# Patient Record
Sex: Female | Born: 1940 | Race: White | Hispanic: No | Smoking: Never smoker
Health system: Southern US, Community
[De-identification: ages and names within clinical notes are randomized; demographics above are authoritative.]

## PROBLEM LIST (undated history)

## (undated) DIAGNOSIS — N189 Chronic kidney disease, unspecified: Secondary | ICD-10-CM

## (undated) DIAGNOSIS — Z8489 Family history of other specified conditions: Secondary | ICD-10-CM

## (undated) DIAGNOSIS — M1990 Unspecified osteoarthritis, unspecified site: Secondary | ICD-10-CM

## (undated) DIAGNOSIS — R112 Nausea with vomiting, unspecified: Secondary | ICD-10-CM

## (undated) DIAGNOSIS — M797 Fibromyalgia: Secondary | ICD-10-CM

## (undated) DIAGNOSIS — F419 Anxiety disorder, unspecified: Secondary | ICD-10-CM

## (undated) DIAGNOSIS — I10 Essential (primary) hypertension: Secondary | ICD-10-CM

## (undated) DIAGNOSIS — E860 Dehydration: Secondary | ICD-10-CM

## (undated) DIAGNOSIS — D649 Anemia, unspecified: Secondary | ICD-10-CM

## (undated) DIAGNOSIS — C801 Malignant (primary) neoplasm, unspecified: Secondary | ICD-10-CM

## (undated) DIAGNOSIS — K219 Gastro-esophageal reflux disease without esophagitis: Secondary | ICD-10-CM

## (undated) DIAGNOSIS — Z87442 Personal history of urinary calculi: Secondary | ICD-10-CM

## (undated) DIAGNOSIS — I82409 Acute embolism and thrombosis of unspecified deep veins of unspecified lower extremity: Secondary | ICD-10-CM

## (undated) DIAGNOSIS — Z9889 Other specified postprocedural states: Secondary | ICD-10-CM

## (undated) DIAGNOSIS — R7303 Prediabetes: Secondary | ICD-10-CM

## (undated) DIAGNOSIS — E039 Hypothyroidism, unspecified: Secondary | ICD-10-CM

## (undated) HISTORY — PX: APPENDECTOMY: SHX54

## (undated) HISTORY — PX: ROTATOR CUFF REPAIR: SHX139

## (undated) HISTORY — PX: BREAST SURGERY: SHX581

## (undated) HISTORY — PX: COLONOSCOPY: SHX174

---

## 1974-11-07 HISTORY — PX: CHOLECYSTECTOMY: SHX55

## 1983-11-08 HISTORY — PX: CARPAL TUNNEL RELEASE: SHX101

## 1983-11-08 HISTORY — PX: THYROIDECTOMY: SHX17

## 1992-11-07 HISTORY — PX: BACK SURGERY: SHX140

## 2006-03-16 ENCOUNTER — Ambulatory Visit (HOSPITAL_BASED_OUTPATIENT_CLINIC_OR_DEPARTMENT_OTHER): Admission: RE | Admit: 2006-03-16 | Discharge: 2006-03-16 | Payer: Self-pay | Admitting: Urology

## 2012-11-07 DIAGNOSIS — C801 Malignant (primary) neoplasm, unspecified: Secondary | ICD-10-CM

## 2012-11-07 HISTORY — DX: Malignant (primary) neoplasm, unspecified: C80.1

## 2013-11-07 HISTORY — PX: COLON SURGERY: SHX602

## 2016-01-18 DIAGNOSIS — I82409 Acute embolism and thrombosis of unspecified deep veins of unspecified lower extremity: Secondary | ICD-10-CM

## 2016-01-18 HISTORY — DX: Acute embolism and thrombosis of unspecified deep veins of unspecified lower extremity: I82.409

## 2016-10-10 ENCOUNTER — Other Ambulatory Visit: Payer: Self-pay | Admitting: Obstetrics and Gynecology

## 2016-10-20 NOTE — Patient Instructions (Signed)
Your procedure is scheduled on:  Monday, Dec. 18, 2017  Enter through the [employer] [facility] at:  10:15 AM  Pick up the phone at the desk and dial [phone].  Call this number if you have problems the morning of surgery: [phone].  Remember: Do NOT eat food or drink after:  Midnight Sunday  Take these medicines the morning of surgery with a SIP OF WATER: Gabapentin, Letrozole, Levothyroxine, Lisinopril, Omeprazole   Stop ALL herbal medications at this time   Do NOT wear jewelry (body piercing), metal hair clips/bobby pins, make-up, or nail polish. Do NOT wear lotions, powders, or perfumes.  You may wear deodorant. Do NOT shave for 48 hours prior to surgery. Do NOT bring valuables to the hospital. Contacts, dentures, or bridgework may not be worn into surgery.  Have a responsible adult drive you home and stay with you for 24 hours after your procedure

## 2016-10-21 ENCOUNTER — Inpatient Hospital Stay (HOSPITAL_COMMUNITY)
Admission: RE | Admit: 2016-10-21 | Discharge: 2016-10-21 | Disposition: A | Discharge status: Home / Self Care | Payer: Self-pay | Source: Ambulatory Visit

## 2016-10-24 ENCOUNTER — Encounter (HOSPITAL_COMMUNITY): Payer: Self-pay

## 2016-10-24 ENCOUNTER — Ambulatory Visit (HOSPITAL_COMMUNITY): Payer: Medicare Other | Admitting: Anesthesiology

## 2016-10-24 ENCOUNTER — Encounter (HOSPITAL_COMMUNITY)
Admission: RE | Disposition: A | Discharge status: Home / Self Care | Payer: Self-pay | Source: Ambulatory Visit | Attending: Obstetrics and Gynecology

## 2016-10-24 ENCOUNTER — Ambulatory Visit (HOSPITAL_COMMUNITY)
Admission: RE | Admit: 2016-10-24 | Discharge: 2016-10-24 | Disposition: A | Discharge status: Home / Self Care | Payer: Medicare Other | Source: Ambulatory Visit | Attending: Obstetrics and Gynecology | Admitting: Obstetrics and Gynecology

## 2016-10-24 DIAGNOSIS — E039 Hypothyroidism, unspecified: Secondary | ICD-10-CM | POA: Diagnosis not present

## 2016-10-24 DIAGNOSIS — Z86718 Personal history of other venous thrombosis and embolism: Secondary | ICD-10-CM | POA: Diagnosis not present

## 2016-10-24 DIAGNOSIS — M1990 Unspecified osteoarthritis, unspecified site: Secondary | ICD-10-CM | POA: Diagnosis not present

## 2016-10-24 DIAGNOSIS — I10 Essential (primary) hypertension: Secondary | ICD-10-CM | POA: Insufficient documentation

## 2016-10-24 DIAGNOSIS — Z853 Personal history of malignant neoplasm of breast: Secondary | ICD-10-CM | POA: Diagnosis not present

## 2016-10-24 DIAGNOSIS — N8501 Benign endometrial hyperplasia: Secondary | ICD-10-CM | POA: Insufficient documentation

## 2016-10-24 DIAGNOSIS — R9389 Abnormal findings on diagnostic imaging of other specified body structures: Secondary | ICD-10-CM

## 2016-10-24 DIAGNOSIS — E7800 Pure hypercholesterolemia, unspecified: Secondary | ICD-10-CM | POA: Insufficient documentation

## 2016-10-24 DIAGNOSIS — N858 Other specified noninflammatory disorders of uterus: Secondary | ICD-10-CM | POA: Diagnosis not present

## 2016-10-24 DIAGNOSIS — Z888 Allergy status to other drugs, medicaments and biological substances status: Secondary | ICD-10-CM | POA: Insufficient documentation

## 2016-10-24 DIAGNOSIS — K219 Gastro-esophageal reflux disease without esophagitis: Secondary | ICD-10-CM | POA: Insufficient documentation

## 2016-10-24 DIAGNOSIS — N840 Polyp of corpus uteri: Secondary | ICD-10-CM

## 2016-10-24 DIAGNOSIS — R938 Abnormal findings on diagnostic imaging of other specified body structures: Secondary | ICD-10-CM | POA: Diagnosis present

## 2016-10-24 HISTORY — DX: Hypothyroidism, unspecified: E03.9

## 2016-10-24 HISTORY — DX: Personal history of urinary calculi: Z87.442

## 2016-10-24 HISTORY — PX: DILATATION & CURETTAGE/HYSTEROSCOPY WITH MYOSURE: SHX6511

## 2016-10-24 HISTORY — DX: Other specified postprocedural states: Z98.890

## 2016-10-24 HISTORY — DX: Malignant (primary) neoplasm, unspecified: C80.1

## 2016-10-24 HISTORY — DX: Acute embolism and thrombosis of unspecified deep veins of unspecified lower extremity: I82.409

## 2016-10-24 HISTORY — DX: Other specified postprocedural states: R11.2

## 2016-10-24 HISTORY — DX: Unspecified osteoarthritis, unspecified site: M19.90

## 2016-10-24 HISTORY — DX: Gastro-esophageal reflux disease without esophagitis: K21.9

## 2016-10-24 HISTORY — DX: Essential (primary) hypertension: I10

## 2016-10-24 LAB — BASIC METABOLIC PANEL
Anion gap: 8 (ref 5–15)
BUN: 28 mg/dL — AB (ref 6–20)
CHLORIDE: 106 mmol/L (ref 101–111)
CO2: 24 mmol/L (ref 22–32)
CREATININE: 1.07 mg/dL — AB (ref 0.44–1.00)
Calcium: 9.8 mg/dL (ref 8.9–10.3)
GFR calc Af Amer: 57 mL/min — ABNORMAL LOW (ref >60)
GFR calc non Af Amer: 49 mL/min — ABNORMAL LOW (ref >60)
Glucose, Bld: 97 mg/dL (ref 65–99)
Potassium: 4.3 mmol/L (ref 3.5–5.1)
SODIUM: 138 mmol/L (ref 135–145)

## 2016-10-24 LAB — CBC
HCT: 40.9 % (ref 36.0–46.0)
HEMOGLOBIN: 13.7 g/dL (ref 12.0–15.0)
MCH: 32 pg (ref 26.0–34.0)
MCHC: 33.5 g/dL (ref 30.0–36.0)
MCV: 95.6 fL (ref 78.0–100.0)
Platelets: 263 10*3/uL (ref 150–400)
RBC: 4.28 MIL/uL (ref 3.87–5.11)
RDW: 15.4 % (ref 11.5–15.5)
WBC: 9.3 10*3/uL (ref 4.0–10.5)

## 2016-10-24 SURGERY — DILATATION & CURETTAGE/HYSTEROSCOPY WITH MYOSURE
Anesthesia: General | Site: Vagina

## 2016-10-24 MED ORDER — SODIUM CHLORIDE 0.9 % IR SOLN
Status: DC | PRN
Start: 2016-10-24 — End: 2016-10-24
  Administered 2016-10-24: 3000 mL

## 2016-10-24 MED ORDER — PROPOFOL 10 MG/ML IV BOLUS
INTRAVENOUS | Status: AC
  Filled 2016-10-24: qty 20

## 2016-10-24 MED ORDER — LACTATED RINGERS IV SOLN
INTRAVENOUS | Status: DC
  Administered 2016-10-24: 11:00:00 via INTRAVENOUS

## 2016-10-24 MED ORDER — PROPOFOL 10 MG/ML IV BOLUS
INTRAVENOUS | Status: DC | PRN
  Administered 2016-10-24 (×3): 20 mg via INTRAVENOUS
  Administered 2016-10-24: 120 mg via INTRAVENOUS
  Administered 2016-10-24: 20 mg via INTRAVENOUS

## 2016-10-24 MED ORDER — DEXAMETHASONE SODIUM PHOSPHATE 10 MG/ML IJ SOLN
INTRAMUSCULAR | Status: DC | PRN
  Administered 2016-10-24: 4 mg via INTRAVENOUS

## 2016-10-24 MED ORDER — EPHEDRINE 5 MG/ML INJ
INTRAVENOUS | Status: AC
  Filled 2016-10-24: qty 10

## 2016-10-24 MED ORDER — FENTANYL CITRATE (PF) 100 MCG/2ML IJ SOLN
INTRAMUSCULAR | Status: DC | PRN
  Administered 2016-10-24: 100 ug via INTRAVENOUS

## 2016-10-24 MED ORDER — LIDOCAINE HCL (CARDIAC) 20 MG/ML IV SOLN
INTRAVENOUS | Status: AC
  Filled 2016-10-24: qty 5

## 2016-10-24 MED ORDER — ONDANSETRON HCL 4 MG/2ML IJ SOLN
INTRAMUSCULAR | Status: AC
  Filled 2016-10-24: qty 2

## 2016-10-24 MED ORDER — LIDOCAINE HCL (CARDIAC) 20 MG/ML IV SOLN
INTRAVENOUS | Status: DC | PRN
  Administered 2016-10-24: 60 mg via INTRAVENOUS

## 2016-10-24 MED ORDER — DEXAMETHASONE SODIUM PHOSPHATE 4 MG/ML IJ SOLN
INTRAMUSCULAR | Status: AC
  Filled 2016-10-24: qty 1

## 2016-10-24 MED ORDER — FENTANYL CITRATE (PF) 100 MCG/2ML IJ SOLN
INTRAMUSCULAR | Status: AC
  Filled 2016-10-24: qty 2

## 2016-10-24 MED ORDER — EPHEDRINE SULFATE 50 MG/ML IJ SOLN
INTRAMUSCULAR | Status: DC | PRN
  Administered 2016-10-24 (×2): 10 mg via INTRAVENOUS

## 2016-10-24 MED ORDER — MIDAZOLAM HCL 2 MG/2ML IJ SOLN
INTRAMUSCULAR | Status: DC | PRN
  Administered 2016-10-24: 1 mg via INTRAVENOUS

## 2016-10-24 MED ORDER — ONDANSETRON HCL 4 MG/2ML IJ SOLN
INTRAMUSCULAR | Status: DC | PRN
  Administered 2016-10-24: 4 mg via INTRAVENOUS

## 2016-10-24 MED ORDER — MIDAZOLAM HCL 2 MG/2ML IJ SOLN
INTRAMUSCULAR | Status: AC
  Filled 2016-10-24: qty 2

## 2016-10-24 MED ORDER — PHENYLEPHRINE 40 MCG/ML (10ML) SYRINGE FOR IV PUSH (FOR BLOOD PRESSURE SUPPORT)
PREFILLED_SYRINGE | INTRAVENOUS | Status: AC
  Filled 2016-10-24: qty 10

## 2016-10-24 SURGICAL SUPPLY — 20 items
CANISTER SUCT 3000ML (MISCELLANEOUS) ×4 IMPLANT
CATH ROBINSON RED A/P 16FR (CATHETERS) ×2 IMPLANT
CLOTH BEACON ORANGE TIMEOUT ST (SAFETY) ×2 IMPLANT
CONTAINER PREFILL 10% NBF 60ML (FORM) ×2 IMPLANT
DEVICE MYOSURE LITE (MISCELLANEOUS) ×2 IMPLANT
DEVICE MYOSURE REACH (MISCELLANEOUS) IMPLANT
ELECT REM PT RETURN 9FT ADLT (ELECTROSURGICAL)
ELECTRODE REM PT RTRN 9FT ADLT (ELECTROSURGICAL) IMPLANT
FILTER ARTHROSCOPY CONVERTOR (FILTER) ×2 IMPLANT
GLOVE BIOGEL PI IND STRL 7.0 (GLOVE) ×2 IMPLANT
GLOVE BIOGEL PI INDICATOR 7.0 (GLOVE) ×2
GLOVE ECLIPSE 6.5 STRL STRAW (GLOVE) ×2 IMPLANT
GOWN STRL REUS W/TWL LRG LVL3 (GOWN DISPOSABLE) ×4 IMPLANT
PACK VAGINAL MINOR WOMEN LF (CUSTOM PROCEDURE TRAY) ×2 IMPLANT
PAD OB MATERNITY 4.3X12.25 (PERSONAL CARE ITEMS) ×2 IMPLANT
SEAL ROD LENS SCOPE MYOSURE (ABLATOR) ×2 IMPLANT
TOWEL OR 17X24 6PK STRL BLUE (TOWEL DISPOSABLE) ×4 IMPLANT
TUBING AQUILEX INFLOW (TUBING) ×2 IMPLANT
TUBING AQUILEX OUTFLOW (TUBING) ×2 IMPLANT
WATER STERILE IRR 1000ML POUR (IV SOLUTION) ×2 IMPLANT

## 2016-10-24 NOTE — Anesthesia Procedure Notes (Signed)
Procedure Name: LMA Insertion Date/Time: 10/24/2016 12:06 PM Performed by: Jonna Munro Pre-anesthesia Checklist: Patient identified, Emergency Drugs available, Suction available, Patient being monitored and Timeout performed Patient Re-evaluated:Patient Re-evaluated prior to inductionOxygen Delivery Method: Circle system utilized Preoxygenation: Pre-oxygenation with 100% oxygen Intubation Type: IV induction LMA: LMA inserted LMA Size: 4.0 Number of attempts: 1 Placement Confirmation: positive ETCO2 and breath sounds checked- equal and bilateral Tube secured with: Tape Dental Injury: Teeth and Oropharynx as per pre-operative assessment

## 2016-10-24 NOTE — Anesthesia Preprocedure Evaluation (Addendum)
Anesthesia Evaluation  Patient identified by MRN, date of birth, ID band Patient awake    Reviewed: Allergy & Precautions, NPO status , Patient's Chart, lab work & pertinent test results  History of Anesthesia Complications (+) PONVNegative for: history of anesthetic complications  Airway Mallampati: II  TM Distance: >3 FB Neck ROM: Full    Dental no notable dental hx. (+) Dental Advisory Given, Partial Upper, Partial Lower   Pulmonary neg pulmonary ROS,    Pulmonary exam normal        Cardiovascular hypertension, Pt. on medications Normal cardiovascular exam     Neuro/Psych negative neurological ROS  negative psych ROS   GI/Hepatic Neg liver ROS, GERD  ,  Endo/Other  Hypothyroidism   Renal/GU negative Renal ROS     Musculoskeletal  (+) Arthritis ,   Abdominal   Peds  Hematology   Anesthesia Other Findings   Reproductive/Obstetrics                            Anesthesia Physical Anesthesia Plan  ASA: III  Anesthesia Plan: General   Post-op Pain Management:    Induction: Intravenous  Airway Management Planned: LMA  Additional Equipment:   Intra-op Plan:   Post-operative Plan: Extubation in OR  Informed Consent:   Plan Discussed with:   Anesthesia Plan Comments:         Anesthesia Quick Evaluation

## 2016-10-24 NOTE — Brief Op Note (Signed)
10/24/2016  1:04 PM  PATIENT:  Teresa Kidd  75 y.o. female  PRE-OPERATIVE DIAGNOSIS:  Endometrial Mass, Endometrial Thickening on Sonogram, History of Breast Cancer  POST-OPERATIVE DIAGNOSIS:  Endometrial Polyp, Endometrial Thickening on Sonogram, History of Breast Cancer  PROCEDURE:  Diagnostic hysteroscopy, hysteroscopic resection of endometrial polyp using myosure, dilation and curettage  SURGEON:  Surgeon(s) and Role:    * Servando Salina, MD - Primary  PHYSICIAN ASSISTANT:   ASSISTANTS: none   ANESTHESIA:   general FINDINGS; posterior endometrial polyp, atrophic endometrium, right tubal ostia seen, left ostia sclerosed  EBL:  Total I/O In: 800 [I.V.:800] Out: 25 [Urine:20; Blood:5]  BLOOD ADMINISTERED:none  DRAINS: none   LOCAL MEDICATIONS USED:  NONE  SPECIMEN:  Source of Specimen:  endometrial polyp, emc  DISPOSITION OF SPECIMEN:  PATHOLOGY  COUNTS:  YES  TOURNIQUET:  * No tourniquets in log *  DICTATION: .Other Dictation: Dictation Number [id number]  PLAN OF CARE: Discharge to home after PACU  PATIENT DISPOSITION:  PACU - hemodynamically stable.   Delay start of Pharmacological VTE agent (>24hrs) due to surgical blood loss or risk of bleeding: no

## 2016-10-24 NOTE — Anesthesia Postprocedure Evaluation (Signed)
Anesthesia Post Note  Patient: Teresa Kidd  Procedure(s) Performed: Procedure(s) (LRB): DILATATION & CURETTAGE/HYSTEROSCOPY WITH MYOSURE (N/A)  Patient location during evaluation: PACU Anesthesia Type: General Level of consciousness: sedated Pain management: pain level controlled Vital Signs Assessment: post-procedure vital signs reviewed and stable Respiratory status: spontaneous breathing and respiratory function stable Cardiovascular status: stable Anesthetic complications: no        Last Vitals:  Vitals:   10/24/16 1315 10/24/16 1330  BP: 121/62 126/60  Pulse: 62 71  Resp: 10 20  Temp:      Last Pain:  Vitals:   10/24/16 1315  TempSrc:   PainSc: 1    Pain Goal: Patients Stated Pain Goal: 3 (10/24/16 0955)               [place]

## 2016-10-24 NOTE — Transfer of Care (Signed)
Immediate Anesthesia Transfer of Care Note  Patient: Teresa Kidd  Procedure(s) Performed: Procedure(s): DILATATION & CURETTAGE/HYSTEROSCOPY WITH MYOSURE (N/A)  Patient Location: PACU  Anesthesia Type:General  Level of Consciousness: awake, alert  and oriented  Airway & Oxygen Therapy: Patient Spontanous Breathing and Patient connected to nasal cannula oxygen  Post-op Assessment: Report given to RN and Post -op Vital signs reviewed and stable  Post vital signs: Reviewed and stable  Last Vitals:  Vitals:   10/24/16 0955  BP: 124/73  Pulse: 72  Resp: 20  Temp: 36.7 C    Last Pain:  Vitals:   10/24/16 0955  TempSrc: Oral      Patients Stated Pain Goal: 3 ([id number])  Complications: No apparent anesthesia complications

## 2016-10-24 NOTE — Discharge Instructions (Signed)
DISCHARGE INSTRUCTIONS: D&C / D&E The following instructions have been prepared to help you care for yourself upon your return home.   Personal hygiene:  Use sanitary pads for vaginal drainage, not tampons.  Shower the day after your procedure.  NO tub baths, pools or Jacuzzis for 2-3 weeks.  Wipe front to back after using the bathroom.  Activity and limitations:  Do NOT drive or operate any equipment for 24 hours. The effects of anesthesia are still present and drowsiness may result.  Do NOT rest in bed all day.  Walking is encouraged.  Walk up and down stairs slowly.  You may resume your normal activity in one to two days or as indicated by your physician.  Sexual activity: NO intercourse for at least 2 weeks after the procedure, or as indicated by your physician.  Diet: Eat a light meal as desired this evening. You may resume your usual diet tomorrow.  Return to work: You may resume your work activities in one to two days or as indicated by your doctor.  What to expect after your surgery: Expect to have vaginal bleeding/discharge for 2-3 days and spotting for up to 10 days. It is not unusual to have soreness for up to 1-2 weeks. You may have a slight burning sensation when you urinate for the first day. Mild cramps may continue for a couple of days. You may have a regular period in 2-6 weeks.  Call your doctor for any of the following:  Excessive vaginal bleeding, saturating and changing one pad every hour.  Inability to urinate 6 hours after discharge from hospital.  Pain not relieved by pain medication.  Fever of 100.4 F or greater.  Unusual vaginal discharge or odor.   Call for an appointment:    Post Anesthesia Home Care Instructions  Activity: Get plenty of rest for the remainder of the day. A responsible adult should stay with you for 24 hours following the procedure.  For the next 24 hours, DO NOT: -Drive a car -Paediatric nurse -Drink alcoholic  beverages -Take any medication unless instructed by your physician -Make any legal decisions or sign important papers.  Meals: Start with liquid foods such as gelatin or soup. Progress to regular foods as tolerated. Avoid greasy, spicy, heavy foods. If nausea and/or vomiting occur, drink only clear liquids until the nausea and/or vomiting subsides. Call your physician if vomiting continues.  Special Instructions/Symptoms: Your throat may feel dry or sore from the anesthesia or the breathing tube placed in your throat during surgery. If this causes discomfort, gargle with warm salt water. The discomfort should disappear within 24 hours.  If you had a scopolamine patch placed behind your ear for the management of post- operative nausea and/or vomiting:  1. The medication in the patch is effective for 72 hours, after which it should be removed.  Wrap patch in a tissue and discard in the trash. Wash hands thoroughly with soap and water. 2. You may remove the patch earlier than 72 hours if you experience unpleasant side effects which may include dry mouth, dizziness or visual disturbances. 3. Avoid touching the patch. Wash your hands with soap and water after contact with the patch.      CALL  IF TEMP>100.4, NOTHING PER VAGINA X 1 WK, CALL IF SOAKING A MAXI  PAD EVERY HOUR OR MORE FREQUENTLY

## 2016-10-25 NOTE — Op Note (Signed)
Teresa Kidd, Teresa Kidd NO.:  [id number]  MEDICAL RECORD NO.:  [id number]  LOCATION:                                 FACILITY:  PHYSICIAN:  Servando Salina, M.D.DATE OF BIRTH:  1941/03/15  DATE OF PROCEDURE:  10/24/2016 DATE OF DISCHARGE:                              OPERATIVE REPORT   PREOPERATIVE DIAGNOSIS:  Endometrial thickening on sonogram, endometrial mass, history breast cancer.  PROCEDURE:  Diagnostic hysteroscopy, hysteroscopic resection of endometrial polyp using MyoSure, dilation and curettage.  POSTOPERATIVE DIAGNOSES:  Endometrial thickening on sonogram, endometrial polyps, history of breast cancer.  ANESTHESIA:  General.  SURGEON:  Servando Salina, M.D.  ASSISTANT:  None.  DESCRIPTION OF PROCEDURE:  Under adequate general anesthesia, the patient was placed in a dorsal lithotomy position.  She was sterilely prepped and draped in usual fashion.  Bladder was catheterized for a small amount of urine.  Examination under anesthesia revealed an anteflexed uterus.  No adnexal masses could be appreciated.  A bivalve speculum was placed in vagina.  Single-tooth tenaculum was placed on the anterior lip of the cervix.  The cervix was then serially dilated up to #[facility] dilator.  A MyoSure hysteroscope was introduced into the uterine cavity.  At that point, pale atrophic endometrium was noted. Posteriorly, there was an endometrial polyp.  The right tubal ostia could be seen.  The left was sclerosed.  Using the Lite MyoSure resectoscope, the posterior polypoid lesion was removed.  The hysteroscope was then removed.  The cavity was gently curetted for scant amount of tissue.  The procedure was felt to be completed at which time, all instruments removed from the vagina.  SPECIMEN:  Labeled endometrial curetting with endometrial polyp was sent to Pathology.  ESTIMATED BLOOD LOSS:  5 mL.  URINE OUTPUT:  20 mL.  INTRAOPERATIVE FLUID:  800  mL.  SPONGE AND INSTRUMENT COUNT:  X2 was correct.  COMPLICATION:  None.  The patient tolerated the procedure well and was transferred to recovery room in stable condition.     Servando Salina, M.D.   ______________________________ Servando Salina, M.D.    [place]/MEDQ  D:  10/24/2016  T:  10/25/2016  Job:  [id number]

## 2016-10-26 ENCOUNTER — Encounter (HOSPITAL_COMMUNITY): Payer: Self-pay | Admitting: Obstetrics and Gynecology

## 2017-08-01 ENCOUNTER — Other Ambulatory Visit: Payer: Self-pay | Admitting: Obstetrics and Gynecology

## 2017-08-14 ENCOUNTER — Other Ambulatory Visit: Payer: Self-pay | Admitting: Urology

## 2017-08-16 NOTE — Progress Notes (Signed)
04-27-17 EKG and LOV from Dr. Hinda [facility] [place] on chart.

## 2017-08-16 NOTE — Patient Instructions (Signed)
Lillah Standre  08/16/2017   Your procedure is scheduled on: 08-22-17   Report to [facility] Main  Entrance Take [place]  elevators to 3rd floor to  [place] at 5:30 AM.   Call this number if you have problems the morning of surgery [phone]   Remember: ONLY 1 PERSON MAY GO WITH YOU TO SHORT STAY TO GET  READY MORNING OF [place].  Do not eat food or drink liquids :After Midnight.     Take these medicines the morning of surgery with A SIP OF WATER: Duloxetine (Cymbalta), Gabapentin (Neurontin),  Femara (Letrozole), and Omeprazole  (Prilosec)                                You may not have any metal on your body including hair pins and              piercings  Do not wear jewelry, make-up, lotions, powders or perfumes, deodorant             Do not wear nail polish.  Do not shave  48 hours prior to surgery.             Do not bring valuables to the hospital. [place].  Contacts, dentures or bridgework may not be worn into surgery.  Leave suitcase in the car. After surgery it may be brought to your room.                Please read over the following fact sheets you were given: _____________________________________________________________________             [facility] - Preparing for Surgery Before surgery, you can play an important role.  Because skin is not sterile, your skin needs to be as free of germs as possible.  You can reduce the number of germs on your skin by washing with CHG (chlorahexidine gluconate) soap before surgery.  CHG is an antiseptic cleaner which kills germs and bonds with the skin to continue killing germs even after washing. Please DO NOT use if you have an allergy to CHG or antibacterial soaps.  If your skin becomes reddened/irritated stop using the CHG and inform your nurse when you arrive at Short Stay. Do not shave (including legs and underarms) for at least 48 hours  prior to the first CHG shower.  You may shave your face/neck. Please follow these instructions carefully:  1.  Shower with CHG Soap the night before surgery and the  morning of Surgery.  2.  If you choose to wash your hair, wash your hair first as usual with your  normal  shampoo.  3.  After you shampoo, rinse your hair and body thoroughly to remove the  shampoo.                           4.  Use CHG as you would any other liquid soap.  You can apply chg directly  to the skin and wash                       Gently with a scrungie or clean washcloth.  5.  Apply the CHG Soap  to your body ONLY FROM THE NECK DOWN.   Do not use on face/ open                           Wound or open sores. Avoid contact with eyes, ears mouth and genitals (private parts).                       Wash face,  Genitals (private parts) with your normal soap.             6.  Wash thoroughly, paying special attention to the area where your surgery  will be performed.  7.  Thoroughly rinse your body with warm water from the neck down.  8.  DO NOT shower/wash with your normal soap after using and rinsing off  the CHG Soap.                9.  Pat yourself dry with a clean towel.            10.  Wear clean pajamas.            11.  Place clean sheets on your bed the night of your first shower and do not  sleep with pets. Day of Surgery : Do not apply any lotions/deodorants the morning of surgery.  Please wear clean clothes to the hospital/surgery center.  FAILURE TO FOLLOW THESE INSTRUCTIONS MAY RESULT IN THE CANCELLATION OF YOUR SURGERY PATIENT SIGNATURE_________________________________  NURSE SIGNATURE__________________________________  ________________________________________________________________________

## 2017-08-17 ENCOUNTER — Encounter (HOSPITAL_COMMUNITY)
Admission: RE | Admit: 2017-08-17 | Discharge: 2017-08-17 | Disposition: A | Discharge status: Home / Self Care | Payer: Medicare Other | Source: Ambulatory Visit | Attending: Urology | Admitting: Urology

## 2017-08-17 ENCOUNTER — Encounter (HOSPITAL_COMMUNITY): Payer: Self-pay

## 2017-08-17 DIAGNOSIS — N814 Uterovaginal prolapse, unspecified: Secondary | ICD-10-CM | POA: Diagnosis not present

## 2017-08-17 DIAGNOSIS — Z01812 Encounter for preprocedural laboratory examination: Secondary | ICD-10-CM | POA: Insufficient documentation

## 2017-08-17 DIAGNOSIS — Z0183 Encounter for blood typing: Secondary | ICD-10-CM | POA: Insufficient documentation

## 2017-08-17 LAB — CBC
HCT: 38.3 % (ref 36.0–46.0)
Hemoglobin: 12.4 g/dL (ref 12.0–15.0)
MCH: 31.4 pg (ref 26.0–34.0)
MCHC: 32.4 g/dL (ref 30.0–36.0)
MCV: 97 fL (ref 78.0–100.0)
PLATELETS: 231 10*3/uL (ref 150–400)
RBC: 3.95 MIL/uL (ref 3.87–5.11)
RDW: 14.4 % (ref 11.5–15.5)
WBC: 9 10*3/uL (ref 4.0–10.5)

## 2017-08-17 LAB — BASIC METABOLIC PANEL
Anion gap: 9 (ref 5–15)
BUN: 19 mg/dL (ref 6–20)
CHLORIDE: 103 mmol/L (ref 101–111)
CO2: 28 mmol/L (ref 22–32)
CREATININE: 1.15 mg/dL — AB (ref 0.44–1.00)
Calcium: 9.6 mg/dL (ref 8.9–10.3)
GFR calc Af Amer: 52 mL/min — ABNORMAL LOW (ref >60)
GFR calc non Af Amer: 45 mL/min — ABNORMAL LOW (ref >60)
Glucose, Bld: 107 mg/dL — ABNORMAL HIGH (ref 65–99)
Potassium: 4.3 mmol/L (ref 3.5–5.1)
SODIUM: 140 mmol/L (ref 135–145)

## 2017-08-17 LAB — ABO/RH: ABO/RH(D): A NEG

## 2017-08-17 LAB — PROTIME-INR
INR: 0.86
Prothrombin Time: 11.6 seconds (ref 11.4–15.2)

## 2017-08-17 LAB — APTT: aPTT: 27 seconds (ref 24–36)

## 2017-08-17 NOTE — Progress Notes (Signed)
04-27-17 Cardiac clearance from Dr. Hinda [facility]. F/u in 1year

## 2017-08-21 NOTE — H&P (Signed)
CC/HPI: Ms Spellman was assessed in the past and we discussed the possibility of her undergoing a transvaginal hysterectomy, a vault suspension and cystocele, and a possible rectocele and/or sling. There is a growth on her uterus and her gynecologist from [place] felt she should have a hysterectomy; therefore, she may wish to have her prolapse surgery at the same time and have myself perform both. She had chose watchful waiting years ago. Since then, she keeps using her pessary almost daily. Since then, she has had breast cancer. Two weeks ago she had a less swollen leg and is now on Xarelto for a left lower leg blood clot.   She has urge incontinence and denies stress incontinence and enuresis. She wears 8 to 9 liners a day of varying severity of leakage.  She voids every 30 to 60 minutes and cannot sit through a 2 hour movie. She gets up 1 to 3 times at night. She sometimes has a poor to good flow and sometimes strains.   She t has had a kidney stone treated by Dr Jeffie Pollock. She has had 3 lower back operations. When she does not wear the pessary, she can feel vaginal bulging. She is prone to constipation. Presentation has not been medically treated.   On pelvic examination, Ms Lemme had a grade 3 cystocele with central defect that just reached the introitus. The cervix descended from 8 or 9 cm to approximately 4 cm. She had a mid vaginal smaller grade 2 rectocele. It was moderate in size and she had laxity of tissues. She had a grade 2 hypermobility of the bladder neck and a negative cough test after cystoscopy.   Ms Obi has worsening urge incontinence, and if she has stress incontinence it appears to be minimal based upon today's history. She has worsening prolapse still requiring a pessary. She just had a blood clot. She has at least hourly frequency and mild to moderate nocturia. The role of cystoscopy discussed.   If Ms Ybanez ever had surgery, she would likely best benefit from a transvaginal  hysterectomy with a vault suspension, a cystocele repair and graft, and likely a rectocele repair. I would have to repeat her urodynamics since now she clinically has an overactive bladder but no stress incontinence.   Ms Oshields will likely transfer her care here. She understands she cannot have surgery for at least 6 months. She is going to see Dr Garwin Brothers and get her gynecologist's medical records for her to review. I will order the urodynamics, and we will proceed accordingly likely within the distant future.   Today on urodynamics her maximum capacity is 220 mL. Her bladder was unstable reaching pressures of 36 cm of water but she did not leak. At 200 mL at 99 cm water she had mildly case. These are with the prolapse reduced. During voluntary voiding she voided 160 mL with a maximum flow of 8 mils per second. Maximum voiding pressures 32 cm water. EMG activity was normal. She appears efficiently. Bladder neck to 70 cm and she has significant prolapse noted   Last visit July 2017  I drew the patient a picture. I talked her about a transvaginal vault suspension with cystocele repair and graft and probable rectocele repair and hysterectomy by Dr. cousins. She does not have stress incontinence with the pessary in for a number reasons I do not recommend sling. Mesh issues were discussed. Delayed sling may be necessary in the future.the patient understands a clinically she primarily has an OAB bladder  and this is treated with medication   I went over my concerns about blood clots especially in a patient with breast cancer and with pelvic surgery with an anesthetic time approximately 3 Hours. She is assessed by Dr. Rosalita Chessman was a vascular physician in [place].  By history her prolapse is bothering her even more than usual. She has failed pessary. She really wants surgery. She is being assessed by Dr. cousins today. I will speak to her tonight. I have a medical clearance letter but she did note that a  hematologist in [place] said she is prone to blood clots based upon blood work. She understands my deep concerns regarding this with the length of her surgery as noted above. I briefly reviewed the surgery and postoperative course again  She is scheduled for surgery I believe October 16     ALLERGIES: [place] CAPS    MEDICATIONS: Levothyroxine Sodium  Levothyroxine Sodium  Lisinopril  Lisinopril  Omeprazole  Omeprazole  Aleve TABS Oral  Aspirin TABS Oral  Atenolol 50 mg tablet Oral  Biotin TABS Oral  Calcium TABS Oral  Duloxetine Hcl  Gabapentin  Glucosamine CAPS Oral  Hydroxychloroquine Sulfate  Letrozole  Letrozole  Multi-Vitamin Oral Tablet Oral  Prednisone  Tizanidine Hcl  Tizanidine Hcl  Xarelto     GU PSH: Complex cystometrogram, w/ void pressure and urethral pressure profile studies, any technique - 05/03/2016 Complex Uroflow - 05/03/2016 Cystoscopy Ureteroscopy - 2011 Emg surf Electrd - 05/03/2016 Inject For cystogram - 05/03/2016 Intrabd voidng Press - 05/03/2016      PSH Notes: Cystoscopy With Ureteroscopy Right, Thyroid Surgery, Shoulder Surgery, Cholecystectomy, Back Surgery   NON-GU PSH: Cholecystectomy (open) - 2011    GU PMH: Cystocele, Unspec, Vaginal wall prolapse - 02/08/2016 Mixed incontinence, Urge and stress incontinence - 02/08/2016 Nocturia, Nocturia - 02/08/2016, Nocturia, - 2014 Urinary Frequency, Increased urinary frequency - 02/08/2016 Chronic cystitis (w/o hematuria), Chronic cystitis - 2014 History of urolithiasis, Nephrolithiasis - 2014 Hydronephrosis Unspec, Hydronephrosis, right - 2014 Incontinence w/o Sensation, Urinary incontinence without sensory awareness - 2014 Rectocele, Rectocele - 2014 Renal cyst, Renal cyst, acquired - 2014      PMH Notes:  2010-09-20 12:42:45 - Note: Arthritis   NON-GU PMH: Encounter for general adult medical examination without abnormal findings, Encounter for preventive health examination - 02/08/2016 Personal  history of other diseases of the circulatory system, History of hypertension - 2014 Personal history of other endocrine, nutritional and metabolic disease, History of hypercholesterolemia - 2014    FAMILY HISTORY: Breast Cancer - Sister Death In The Family Father - Runs In Family Death In The Family Mother - Runs In Family Family Health Status Number - Runs In Family Gastric Cancer - Sister   SOCIAL HISTORY: None    Notes: Never A Smoker, Alcohol Use, Occupation:, Tobacco Use, Marital History - Single, Caffeine Use   REVIEW OF SYSTEMS:    GU Review Female:   Patient denies frequent urination, hard to postpone urination, burning /pain with urination, get up at night to urinate, leakage of urine, stream starts and stops, trouble starting your stream, have to strain to urinate, and being pregnant.  Gastrointestinal (Upper):   Patient denies nausea, vomiting, and indigestion/ heartburn.  Gastrointestinal (Lower):   Patient reports constipation. Patient denies diarrhea.  Constitutional:   Patient reports fatigue. Patient denies fever, night sweats, and weight loss.  Skin:   Patient denies skin rash/ lesion and itching.  Eyes:   Patient reports blurred vision. Patient denies double vision.  Ears/  Nose/ Throat:   Patient reports sinus problems. Patient denies sore throat.  Hematologic/Lymphatic:   Patient denies swollen glands and easy bruising.  Cardiovascular:   Patient reports leg swelling. Patient denies chest pains.  Respiratory:   Patient denies shortness of breath and cough.  Endocrine:   Patient denies excessive thirst.  Musculoskeletal:   Patient reports back pain and joint pain.   Neurological:   Patient denies headaches and dizziness.  Psychologic:   Patient denies depression and anxiety.   VITAL SIGNS:      08/03/2017 02:09 PM  Weight 137 lb / 62.14 kg  Height 60 in / 152.4 cm  BP 158/78 mmHg  Pulse 69 /min  Temperature 97.9 F / 36.6 C  BMI 26.8 kg/m   PAST DATA REVIEWED:   Source Of History:  Patient   PROCEDURES: None   ASSESSMENT:      ICD-10 Details  1 GU:   Cystocele, Unspec - N81.10      PLAN:           Orders Labs Urine Culture          Schedule Return Visit/Planned Activity: Return PRN - Office Visit             Note: we will call           Document Letter(s):  Created for Patient: Clinical Summary     After a thorough review of the management options for the patient's condition the patient  elected to proceed with surgical therapy as noted above. We have discussed the potential benefits and risks of the procedure, side effects of the proposed treatment, the likelihood of the patient achieving the goals of the procedure, and any potential problems that might occur during the procedure or recuperation. Informed consent has been obtained.

## 2017-08-22 ENCOUNTER — Encounter (HOSPITAL_COMMUNITY)
Admission: RE | Disposition: A | Discharge status: Home / Self Care | Payer: Self-pay | Source: Ambulatory Visit | Attending: Urology

## 2017-08-22 ENCOUNTER — Encounter (HOSPITAL_COMMUNITY): Payer: Self-pay | Admitting: *Deleted

## 2017-08-22 ENCOUNTER — Inpatient Hospital Stay (HOSPITAL_COMMUNITY): Payer: Medicare Other | Admitting: Certified Registered"

## 2017-08-22 ENCOUNTER — Inpatient Hospital Stay (HOSPITAL_COMMUNITY)
Admission: RE | Admit: 2017-08-22 | Discharge: 2017-08-23 | DRG: 743 | Disposition: A | Discharge status: Home / Self Care | Payer: Medicare Other | Source: Ambulatory Visit | Attending: Urology | Admitting: Urology

## 2017-08-22 DIAGNOSIS — N72 Inflammatory disease of cervix uteri: Secondary | ICD-10-CM | POA: Diagnosis present

## 2017-08-22 DIAGNOSIS — Z803 Family history of malignant neoplasm of breast: Secondary | ICD-10-CM

## 2017-08-22 DIAGNOSIS — E039 Hypothyroidism, unspecified: Secondary | ICD-10-CM | POA: Diagnosis present

## 2017-08-22 DIAGNOSIS — N8111 Cystocele, midline: Secondary | ICD-10-CM | POA: Diagnosis present

## 2017-08-22 DIAGNOSIS — Z9049 Acquired absence of other specified parts of digestive tract: Secondary | ICD-10-CM

## 2017-08-22 DIAGNOSIS — Z87442 Personal history of urinary calculi: Secondary | ICD-10-CM | POA: Diagnosis not present

## 2017-08-22 DIAGNOSIS — N814 Uterovaginal prolapse, unspecified: Secondary | ICD-10-CM | POA: Diagnosis present

## 2017-08-22 DIAGNOSIS — Z79899 Other long term (current) drug therapy: Secondary | ICD-10-CM

## 2017-08-22 DIAGNOSIS — I10 Essential (primary) hypertension: Secondary | ICD-10-CM | POA: Diagnosis present

## 2017-08-22 DIAGNOSIS — Z853 Personal history of malignant neoplasm of breast: Secondary | ICD-10-CM | POA: Diagnosis not present

## 2017-08-22 DIAGNOSIS — K219 Gastro-esophageal reflux disease without esophagitis: Secondary | ICD-10-CM | POA: Diagnosis present

## 2017-08-22 DIAGNOSIS — Z800 Family history of malignant neoplasm of digestive organs: Secondary | ICD-10-CM | POA: Diagnosis not present

## 2017-08-22 DIAGNOSIS — Z7982 Long term (current) use of aspirin: Secondary | ICD-10-CM | POA: Diagnosis not present

## 2017-08-22 DIAGNOSIS — Z86718 Personal history of other venous thrombosis and embolism: Secondary | ICD-10-CM | POA: Diagnosis not present

## 2017-08-22 DIAGNOSIS — D259 Leiomyoma of uterus, unspecified: Secondary | ICD-10-CM | POA: Diagnosis present

## 2017-08-22 DIAGNOSIS — D271 Benign neoplasm of left ovary: Secondary | ICD-10-CM | POA: Diagnosis present

## 2017-08-22 HISTORY — PX: CYSTOSCOPY: SHX5120

## 2017-08-22 HISTORY — PX: VAGINAL PROLAPSE REPAIR: SHX830

## 2017-08-22 HISTORY — PX: SALPINGOOPHORECTOMY: SHX82

## 2017-08-22 HISTORY — PX: VAGINAL HYSTERECTOMY: SHX2639

## 2017-08-22 HISTORY — PX: CYSTOCELE REPAIR: SHX163

## 2017-08-22 LAB — HEMOGLOBIN AND HEMATOCRIT, BLOOD
HCT: 36.6 % (ref 36.0–46.0)
HEMOGLOBIN: 11.9 g/dL — AB (ref 12.0–15.0)

## 2017-08-22 LAB — TYPE AND SCREEN
ABO/RH(D): A NEG
ANTIBODY SCREEN: NEGATIVE

## 2017-08-22 SURGERY — COLPORRHAPHY, ANTERIOR, FOR CYSTOCELE REPAIR
Anesthesia: General

## 2017-08-22 MED ORDER — FENTANYL CITRATE (PF) 100 MCG/2ML IJ SOLN
INTRAMUSCULAR | Status: AC
  Filled 2017-08-22: qty 2

## 2017-08-22 MED ORDER — ROCURONIUM BROMIDE 100 MG/10ML IV SOLN
INTRAVENOUS | Status: DC | PRN
  Administered 2017-08-22: 20 mg via INTRAVENOUS
  Administered 2017-08-22: 50 mg via INTRAVENOUS
  Administered 2017-08-22: 10 mg via INTRAVENOUS

## 2017-08-22 MED ORDER — ESTRADIOL 0.1 MG/GM VA CREA
TOPICAL_CREAM | VAGINAL | Status: DC | PRN
  Administered 2017-08-22: 1 via VAGINAL

## 2017-08-22 MED ORDER — LACTATED RINGERS IV SOLN
INTRAVENOUS | Status: DC | PRN
  Administered 2017-08-22 (×3): via INTRAVENOUS

## 2017-08-22 MED ORDER — HYDROXYCHLOROQUINE SULFATE 200 MG PO TABS
200.0000 mg | ORAL_TABLET | Freq: Every day | ORAL | Status: DC
  Administered 2017-08-22 – 2017-08-23 (×2): 200 mg via ORAL
  Filled 2017-08-22 (×2): qty 1

## 2017-08-22 MED ORDER — ESTRADIOL 0.1 MG/GM VA CREA
TOPICAL_CREAM | VAGINAL | Status: AC
  Filled 2017-08-22: qty 85

## 2017-08-22 MED ORDER — ONDANSETRON HCL 4 MG/2ML IJ SOLN: 4.0000 mg | INTRAMUSCULAR | Status: DC | PRN

## 2017-08-22 MED ORDER — PREDNISOLONE 5 MG PO TABS
5.0000 mg | ORAL_TABLET | Freq: Every day | ORAL | Status: DC
  Administered 2017-08-22 – 2017-08-23 (×2): 5 mg via ORAL
  Filled 2017-08-22 (×2): qty 1

## 2017-08-22 MED ORDER — DEXTROSE-NACL 5-0.45 % IV SOLN
INTRAVENOUS | Status: DC
  Administered 2017-08-22 – 2017-08-23 (×2): via INTRAVENOUS

## 2017-08-22 MED ORDER — ONDANSETRON HCL 4 MG/2ML IJ SOLN: 4.0000 mg | Freq: Four times a day (QID) | INTRAMUSCULAR | Status: DC | PRN

## 2017-08-22 MED ORDER — LEVOTHYROXINE SODIUM 75 MCG PO TABS
75.0000 ug | ORAL_TABLET | Freq: Every day | ORAL | Status: DC
  Administered 2017-08-22 – 2017-08-23 (×2): 75 ug via ORAL
  Filled 2017-08-22 (×2): qty 1

## 2017-08-22 MED ORDER — LIDOCAINE 2% (20 MG/ML) 5 ML SYRINGE
INTRAMUSCULAR | Status: AC
  Filled 2017-08-22: qty 25

## 2017-08-22 MED ORDER — HYDROCODONE-ACETAMINOPHEN 5-325 MG PO TABS: 1.0000 | ORAL_TABLET | Freq: Four times a day (QID) | ORAL | 0 refills | Status: DC | PRN

## 2017-08-22 MED ORDER — STERILE WATER FOR IRRIGATION IR SOLN
Status: DC | PRN
  Administered 2017-08-22: 1

## 2017-08-22 MED ORDER — SODIUM CHLORIDE 0.9 % IV SOLN
INTRAVENOUS | Status: DC | PRN
  Administered 2017-08-22: 500 mL

## 2017-08-22 MED ORDER — OXYCODONE HCL 5 MG PO TABS: 5.0000 mg | ORAL_TABLET | Freq: Once | ORAL | Status: DC | PRN

## 2017-08-22 MED ORDER — GLYCOPYRROLATE 0.2 MG/ML IJ SOLN
INTRAMUSCULAR | Status: DC | PRN
  Administered 2017-08-22: 0.2 mg via INTRAVENOUS

## 2017-08-22 MED ORDER — FENTANYL CITRATE (PF) 250 MCG/5ML IJ SOLN
INTRAMUSCULAR | Status: AC
  Filled 2017-08-22: qty 5

## 2017-08-22 MED ORDER — DIPHENHYDRAMINE HCL 50 MG/ML IJ SOLN: 12.5000 mg | Freq: Four times a day (QID) | INTRAMUSCULAR | Status: DC | PRN

## 2017-08-22 MED ORDER — LISINOPRIL 5 MG PO TABS
5.0000 mg | ORAL_TABLET | Freq: Every day | ORAL | Status: DC
  Administered 2017-08-23: 5 mg via ORAL
  Filled 2017-08-22 (×2): qty 1

## 2017-08-22 MED ORDER — SCOPOLAMINE 1 MG/3DAYS TD PT72
MEDICATED_PATCH | TRANSDERMAL | Status: DC | PRN
  Administered 2017-08-22: 1 via TRANSDERMAL

## 2017-08-22 MED ORDER — DIPHENHYDRAMINE HCL 12.5 MG/5ML PO ELIX: 12.5000 mg | ORAL_SOLUTION | Freq: Four times a day (QID) | ORAL | Status: DC | PRN

## 2017-08-22 MED ORDER — LIDOCAINE-EPINEPHRINE (PF) 1 %-1:200000 IJ SOLN
INTRAMUSCULAR | Status: DC | PRN
  Administered 2017-08-22: 30 mL

## 2017-08-22 MED ORDER — LORATADINE 10 MG PO TABS
10.0000 mg | ORAL_TABLET | Freq: Every day | ORAL | Status: DC
  Administered 2017-08-22 – 2017-08-23 (×2): 10 mg via ORAL
  Filled 2017-08-22 (×2): qty 1

## 2017-08-22 MED ORDER — MIDAZOLAM HCL 2 MG/2ML IJ SOLN
INTRAMUSCULAR | Status: AC
  Filled 2017-08-22: qty 2

## 2017-08-22 MED ORDER — LETROZOLE 2.5 MG PO TABS
2.5000 mg | ORAL_TABLET | Freq: Every day | ORAL | Status: DC
Start: 2017-08-23 — End: 2017-08-23
  Administered 2017-08-23: 2.5 mg via ORAL
  Filled 2017-08-22: qty 1

## 2017-08-22 MED ORDER — ACETAMINOPHEN 325 MG PO TABS
650.0000 mg | ORAL_TABLET | ORAL | Status: DC | PRN
  Administered 2017-08-22: 650 mg via ORAL
  Filled 2017-08-22: qty 2

## 2017-08-22 MED ORDER — LIDOCAINE HCL (CARDIAC) 20 MG/ML IV SOLN
INTRAVENOUS | Status: DC | PRN
  Administered 2017-08-22: 60 mg via INTRATRACHEAL

## 2017-08-22 MED ORDER — MIDAZOLAM HCL 2 MG/2ML IJ SOLN
INTRAMUSCULAR | Status: DC | PRN
  Administered 2017-08-22: 2 mg via INTRAVENOUS

## 2017-08-22 MED ORDER — MORPHINE SULFATE (PF) 2 MG/ML IV SOLN: 2.0000 mg | INTRAVENOUS | Status: DC | PRN

## 2017-08-22 MED ORDER — PROPOFOL 10 MG/ML IV BOLUS
INTRAVENOUS | Status: DC | PRN
  Administered 2017-08-22: 80 mg via INTRAVENOUS

## 2017-08-22 MED ORDER — SCOPOLAMINE 1 MG/3DAYS TD PT72
MEDICATED_PATCH | TRANSDERMAL | Status: AC
  Filled 2017-08-22: qty 1

## 2017-08-22 MED ORDER — CEFAZOLIN SODIUM-DEXTROSE 2-4 GM/100ML-% IV SOLN
2.0000 g | INTRAVENOUS | Status: AC
  Administered 2017-08-22: 2 g via INTRAVENOUS

## 2017-08-22 MED ORDER — FENTANYL CITRATE (PF) 100 MCG/2ML IJ SOLN: 25.0000 ug | INTRAMUSCULAR | Status: DC | PRN

## 2017-08-22 MED ORDER — HYDROCODONE-ACETAMINOPHEN 5-325 MG PO TABS: 1.0000 | ORAL_TABLET | ORAL | Status: DC | PRN

## 2017-08-22 MED ORDER — CEFAZOLIN SODIUM-DEXTROSE 2-4 GM/100ML-% IV SOLN
INTRAVENOUS | Status: AC
Start: 2017-08-22 — End: 2017-08-22
  Filled 2017-08-22: qty 100

## 2017-08-22 MED ORDER — ONDANSETRON HCL 4 MG/2ML IJ SOLN
INTRAMUSCULAR | Status: DC | PRN
  Administered 2017-08-22: 4 mg via INTRAVENOUS

## 2017-08-22 MED ORDER — EPHEDRINE SULFATE 50 MG/ML IJ SOLN
INTRAMUSCULAR | Status: DC | PRN
  Administered 2017-08-22 (×5): 10 mg via INTRAVENOUS

## 2017-08-22 MED ORDER — GABAPENTIN 300 MG PO CAPS
600.0000 mg | ORAL_CAPSULE | Freq: Three times a day (TID) | ORAL | Status: DC
  Administered 2017-08-22 – 2017-08-23 (×3): 600 mg via ORAL
  Filled 2017-08-22 (×3): qty 2

## 2017-08-22 MED ORDER — LIDOCAINE-EPINEPHRINE (PF) 1 %-1:200000 IJ SOLN
INTRAMUSCULAR | Status: AC
  Filled 2017-08-22: qty 30

## 2017-08-22 MED ORDER — FLEET ENEMA 7-19 GM/118ML RE ENEM: 1.0000 | ENEMA | Freq: Once | RECTAL | Status: DC

## 2017-08-22 MED ORDER — DULOXETINE HCL 30 MG PO CPEP
30.0000 mg | ORAL_CAPSULE | Freq: Every day | ORAL | Status: DC
  Administered 2017-08-22 – 2017-08-23 (×2): 30 mg via ORAL
  Filled 2017-08-22 (×2): qty 1

## 2017-08-22 MED ORDER — OXYCODONE HCL 5 MG/5ML PO SOLN: 5.0000 mg | Freq: Once | ORAL | Status: DC | PRN

## 2017-08-22 MED ORDER — LIDOCAINE-EPINEPHRINE (PF) 1 %-1:200000 IJ SOLN
INTRAMUSCULAR | Status: AC
  Filled 2017-08-22: qty 60

## 2017-08-22 MED ORDER — PROPOFOL 10 MG/ML IV BOLUS
INTRAVENOUS | Status: AC
  Filled 2017-08-22: qty 40

## 2017-08-22 MED ORDER — SODIUM CHLORIDE 0.9 % IV SOLN
INTRAVENOUS | Status: AC
  Filled 2017-08-22: qty 500000

## 2017-08-22 MED ORDER — FENTANYL CITRATE (PF) 250 MCG/5ML IJ SOLN
INTRAMUSCULAR | Status: DC | PRN
  Administered 2017-08-22 (×2): 50 ug via INTRAVENOUS
  Administered 2017-08-22: 100 ug via INTRAVENOUS
  Administered 2017-08-22 (×2): 50 ug via INTRAVENOUS

## 2017-08-22 MED ORDER — SUGAMMADEX SODIUM 200 MG/2ML IV SOLN
INTRAVENOUS | Status: DC | PRN
  Administered 2017-08-22: 120 mg via INTRAVENOUS

## 2017-08-22 MED ORDER — PHENAZOPYRIDINE HCL 200 MG PO TABS
200.0000 mg | ORAL_TABLET | Freq: Once | ORAL | Status: AC
  Administered 2017-08-22: 200 mg via ORAL
  Filled 2017-08-22: qty 1

## 2017-08-22 MED ORDER — PANTOPRAZOLE SODIUM 40 MG PO TBEC
40.0000 mg | DELAYED_RELEASE_TABLET | Freq: Every day | ORAL | Status: DC
  Administered 2017-08-23: 40 mg via ORAL
  Filled 2017-08-22: qty 1

## 2017-08-22 MED ORDER — DEXAMETHASONE SODIUM PHOSPHATE 10 MG/ML IJ SOLN
INTRAMUSCULAR | Status: DC | PRN
  Administered 2017-08-22: 10 mg via INTRAVENOUS
  Administered 2017-08-22: 8 mg via INTRAVENOUS

## 2017-08-22 SURGICAL SUPPLY — 64 items
ALLOGRAFT TUTOPLAST AXIS 6X12 (Tissue) ×2 IMPLANT
BAG URINE DRAINAGE (UROLOGICAL SUPPLIES) ×3 IMPLANT
BAG URO CATCHER STRL LF (MISCELLANEOUS) ×3 IMPLANT
BLADE SURG 15 STRL LF DISP TIS (BLADE) ×2 IMPLANT
BLADE SURG 15 STRL SS (BLADE) ×1
CATH FOLEY 2WAY SLVR  5CC 14FR (CATHETERS) ×1
CATH FOLEY 2WAY SLVR 5CC 14FR (CATHETERS) ×2 IMPLANT
CLOTH BEACON ORANGE TIMEOUT ST (SAFETY) ×6 IMPLANT
COVER MAYO STAND STRL (DRAPES) ×3 IMPLANT
COVER SURGICAL LIGHT HANDLE (MISCELLANEOUS) ×6 IMPLANT
DECANTER SPIKE VIAL GLASS SM (MISCELLANEOUS) ×3 IMPLANT
DEVICE CAPIO SLIM SINGLE (INSTRUMENTS) ×6 IMPLANT
DRAIN PENROSE 18X1/4 LTX STRL (WOUND CARE) ×3 IMPLANT
DRAPE SHEET LG 3/4 BI-LAMINATE (DRAPES) ×3 IMPLANT
DRAPE STERI URO 9X17 APER PCH (DRAPES) ×3 IMPLANT
ELECT PENCIL ROCKER SW 15FT (MISCELLANEOUS) ×3 IMPLANT
GAUZE PACKING 2X5 YD STRL (GAUZE/BANDAGES/DRESSINGS) ×3 IMPLANT
GAUZE SPONGE 4X4 16PLY XRAY LF (GAUZE/BANDAGES/DRESSINGS) ×9 IMPLANT
GLOVE BIO SURGEON STRL SZ 6.5 (GLOVE) ×3 IMPLANT
GLOVE BIOGEL M STRL SZ7.5 (GLOVE) ×3 IMPLANT
GLOVE BIOGEL PI IND STRL 7.0 (GLOVE) ×2 IMPLANT
GLOVE BIOGEL PI INDICATOR 7.0 (GLOVE) ×1
GLOVE ECLIPSE 6.5 STRL STRAW (GLOVE) ×3 IMPLANT
GLOVE ECLIPSE 8.5 STRL (GLOVE) ×3 IMPLANT
GOWN STRL REUS W/TWL XL LVL3 (GOWN DISPOSABLE) ×12 IMPLANT
HOLDER FOLEY CATH W/STRAP (MISCELLANEOUS) ×3 IMPLANT
IV NS 1000ML (IV SOLUTION) ×1
IV NS 1000ML BAXH (IV SOLUTION) ×2 IMPLANT
KIT BASIN OR (CUSTOM PROCEDURE TRAY) ×3 IMPLANT
LIGASURE IMPACT 36 18CM CVD LR (INSTRUMENTS) ×3 IMPLANT
MANIFOLD NEPTUNE II (INSTRUMENTS) ×3 IMPLANT
NEEDLE HYPO 22GX1.5 SAFETY (NEEDLE) ×3 IMPLANT
NEEDLE MAYO 6 CRC TAPER PT (NEEDLE) ×3 IMPLANT
NEEDLE SPNL 22GX3.5 QUINCKE BK (NEEDLE) ×3 IMPLANT
NS IRRIG 1000ML POUR BTL (IV SOLUTION) ×3 IMPLANT
PACK CYSTO (CUSTOM PROCEDURE TRAY) ×3 IMPLANT
PACK GENERAL/GYN (CUSTOM PROCEDURE TRAY) ×3 IMPLANT
PAD OB MATERNITY 4.3X12.25 (PERSONAL CARE ITEMS) ×3 IMPLANT
PLUG CATH AND CAP STER (CATHETERS) ×3 IMPLANT
RETRACTOR STAY HOOK 5MM (MISCELLANEOUS) ×3 IMPLANT
SHEET LAVH (DRAPES) ×3 IMPLANT
SPONGE LAP 4X18 X RAY DECT (DISPOSABLE) ×3 IMPLANT
SUT CAPIO ETHIBPND (SUTURE) ×6 IMPLANT
SUT VIC AB 0 CT1 18XCR BRD 8 (SUTURE) ×2 IMPLANT
SUT VIC AB 0 CT1 27 (SUTURE) ×1
SUT VIC AB 0 CT1 27XBRD ANTBC (SUTURE) ×2 IMPLANT
SUT VIC AB 0 CT1 36 (SUTURE) ×12 IMPLANT
SUT VIC AB 0 CT1 8-18 (SUTURE) ×1
SUT VIC AB 2-0 CT1 27 (SUTURE) ×1
SUT VIC AB 2-0 CT1 27XBRD (SUTURE) ×2 IMPLANT
SUT VIC AB 2-0 SH 27 (SUTURE) ×5
SUT VIC AB 2-0 SH 27X BRD (SUTURE) ×10 IMPLANT
SUT VIC AB 3-0 SH 27 (SUTURE) ×2
SUT VIC AB 3-0 SH 27XBRD (SUTURE) ×4 IMPLANT
SUT VICRYL 0 UR6 27IN ABS (SUTURE) ×12 IMPLANT
SYR 10ML LL (SYRINGE) ×3 IMPLANT
TOWEL OR 17X26 10 PK STRL BLUE (TOWEL DISPOSABLE) ×9 IMPLANT
TOWEL OR NON WOVEN STRL DISP B (DISPOSABLE) ×3 IMPLANT
TRAY FOLEY CATH 16FR SILVER (SET/KITS/TRAYS/PACK) ×3 IMPLANT
TUBING CONNECTING 10 (TUBING) ×3 IMPLANT
TUTOPLAST AXIS 6X12 (Tissue) ×3 IMPLANT
WATER STERILE IRR 1000ML POUR (IV SOLUTION) ×3 IMPLANT
WATER STERILE IRR 1500ML POUR (IV SOLUTION) ×3 IMPLANT
YANKAUER SUCT BULB TIP 10FT TU (MISCELLANEOUS) ×3 IMPLANT

## 2017-08-22 NOTE — Interval H&P Note (Signed)
History and Physical Interval Note:  08/22/2017 7:17 AM  Teresa Kidd  has presented today for surgery, with the diagnosis of cystocele,rectocele, valut prolapse  The various methods of treatment have been discussed with the patient and family. After consideration of risks, benefits and other options for treatment, the patient has consented to  Procedure(s): ANTERIOR (CYSTOCELE) AND POSTERIOR REPAIR (RECTOCELE) WITH GRAFT (N/A) CYSTOSCOPY (N/A) VAGINAL VAULT PROLAPSE REPAIR (N/A) TOTAL HYSTERECTOMY VAGINAL (N/A) BILATERAL SALPINGO OOPHORECTOMY (Bilateral) as a surgical intervention .  The patient's history has been reviewed, patient examined, no change in status, stable for surgery.  I have reviewed the patient's chart and labs.  Questions were answered to the patient's satisfaction.     Gearldean Lomanto A

## 2017-08-22 NOTE — Progress Notes (Signed)
Paged Dr. Matilde Sprang regarding fleets enema order at [phone] and [phone].

## 2017-08-22 NOTE — Transfer of Care (Signed)
Immediate Anesthesia Transfer of Care Note  Patient: Teresa Kidd  Procedure(s) Performed: ANTERIOR (CYSTOCELE) WITH GRAFT (N/A ) CYSTOSCOPY (N/A ) VAGINAL VAULT PROLAPSE REPAIR (N/A ) TOTAL HYSTERECTOMY VAGINAL (N/A ) BILATERAL SALPINGO OOPHORECTOMY (Bilateral )  Patient Location: PACU  Anesthesia Type:General  Level of Consciousness: awake, drowsy and patient cooperative  Airway & Oxygen Therapy: Patient connected to face mask oxygen  Post-op Assessment: Report given to RN, Post -op Vital signs reviewed and stable and Patient moving all extremities X 4  Post vital signs: stable  Last Vitals:  Vitals:   08/22/17 0531  BP: (!) 158/72  Pulse: (!) 56  Resp: 16  Temp: 36.8 C  SpO2: 98%    Last Pain:  Vitals:   08/22/17 0531  TempSrc: Oral      Patients Stated Pain Goal: 3 (24/46/28 6381)  Complications: No apparent anesthesia complications

## 2017-08-22 NOTE — Progress Notes (Signed)
Urology Progress Note   Day of Surgery  Subjective: Doing well, no issues. Pain well controlled. No n/v.   Objective: Vital signs in last 24 hours: Temp:  [94.3 F (34.6 C)-98.2 F (36.8 C)] 97.6 F (36.4 C) (10/16 1500) Pulse Rate:  [56-79] 71 (10/16 1500) Resp:  [11-18] 18 (10/16 1500) BP: (131-168)/(67-142) 131/67 (10/16 1500) SpO2:  [98 %-100 %] 99 % (10/16 1500) Weight:  [63 kg (139 lb)] 63 kg (139 lb) (10/16 0620)  Intake/Output from previous day: No intake/output data recorded. Intake/Output this shift: Total I/O In: 3100 [I.V.:3100] Out: 130 [Urine:30; Blood:100]  Physical Exam:  General: Alert and oriented CV: RRR Lungs: Clear Abdomen: Soft, appropriately tender. GU: Foley in place draining clear orange urine  Ext: NT, No erythema  Lab Results: No results for input(s): HGB, HCT in the last 72 hours. BMET No results for input(s): NA, K, CL, CO2, GLUCOSE, BUN, CREATININE, CALCIUM in the last 72 hours.   Studies/Results: No results found.  Assessment/Plan:  76 y.o. female s/p transvaginal hysterectomy with bilateral salpingoophorectomy with an anterior repqir for cystocele on 08/22/17.  Overall doing well post-op.   - Continue to monitor overnight - IS, OOB   Dispo: Home likely tomorrow   LOS: 0 days   Stasia Cavalier 08/22/2017, 3:16 PM

## 2017-08-22 NOTE — Discharge Instructions (Signed)
As discussed with Dr. Macdiarmid. °

## 2017-08-22 NOTE — Op Note (Signed)
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 Nicki Reaper Mandolin Falwell Asst.: Estill Bamberg dancy   The assistant was present and necessary for all steps of the operation described. The assistant played a critical role assisting during the operation  The patient was prepped and draped in the usual fashion. Extra care was taken with leg positioning to minimize the risk of compartment syndrome and neuropathy and deep vein thrombosis. Preoperative antibiotics were given. Hysterectomy was performed. Vaginal cuff posteriorly was run. Weak ureteral sacral ligaments were tagged  The patient had a long anterior vaginal wall and a short posterior vaginal wall with very little rectocele. She had a moderate central defect and a lot of redundancy of her cystocele. 20 mL of a lidocaine epinephrine mixture was instilled. With my Allis technique I made a long anterior vaginal wall incision and sharply mobilized the overlying vaginal mucosa from the underlying pubocervical fascia to the white line bilaterally and I mobilized very well at the apex. I was very pleased with the mobilization.  I was very diligent to keep her anatomy anatomic in doing the anterior repair with running 2-0 Vicryl. I did not imbricate the bladder neck. I did not want distort the redundant anatomy and overcorrected her.  I had cystoscoped the patient after the hysterectomy and she had good efflux bilaterally. Following the cystocele repair I did the same. There was an excellent repair with no bladder injury. Ureters were normal with excellent efflux  Following this I did my appropriate blunt dissection along the pelvic sidewall down to the ischial spine bilaterally. I mobilized soft tissues medially. She had a weak sacrospinous ligament that was mobile bilaterally. With  my usual technique and the Capio device I placed a 0 Ethibond more than 1 full finger breath medial to the spine in a straight line between the spines. I triple checked them. I did a rectal examination and there is no rectal injury.  With my usual technique I exposed urethrovesical angle and placed a 2-0 Vicryl into the pelvic sidewall.  I fashioned a 10 x 6 fascial graft in the shape of a hexagon and sewed it in place between the 4 sutures. I was very pleased with its placement  She had a surprising amount of vaginal wall mucosa to removed and appropriatly did so. I then closed the anterior vaginal wall with running 2-0 Vicryl on a CT1 needle.  I then closed the cuff with 0 Vicryl from left to right and right to left from each apex. I reapproximated the week ureteral sacral ligaments in the midline. I was pleased with the apical closure  I spent a few minutes inspecting her vaginal anatomy and small rectocele. She has some shortening of the vagina and a rectocele repair would likely shorten it more so at the apex. She really did not have much of a rectocele but more elasticity of tissues with a little bit of a rectocele and some mild to moderate perineal bulging. I did not think that the extra time in surgery at her age and based upon anatomic findings I did not feel she needed a posterior repair  Urine output was excellent throughout the case. Leg position was good. Blood loss was less than 100 mL. Vaginal pack with Estrace cream was applied. Hopefully the operation will reach the patient's treatment goal

## 2017-08-22 NOTE — Op Note (Signed)
complete

## 2017-08-22 NOTE — Anesthesia Preprocedure Evaluation (Signed)
Anesthesia Evaluation  Patient identified by MRN, date of birth, ID band Patient awake    Reviewed: Allergy & Precautions, H&P , NPO status , Patient's Chart, lab work & pertinent test results  History of Anesthesia Complications (+) PONV  Airway Mallampati: II   Neck ROM: full    Dental   Pulmonary neg pulmonary ROS,    breath sounds clear to auscultation       Cardiovascular hypertension,  Rhythm:regular Rate:Normal     Neuro/Psych    GI/Hepatic GERD  ,  Endo/Other  Hypothyroidism   Renal/GU stones     Musculoskeletal  (+) Arthritis ,   Abdominal   Peds  Hematology   Anesthesia Other Findings   Reproductive/Obstetrics H/o breast CA                             Anesthesia Physical Anesthesia Plan  ASA: II  Anesthesia Plan: General   Post-op Pain Management:    Induction: Intravenous  PONV Risk Score and Plan: 4 or greater and Ondansetron and Dexamethasone  Airway Management Planned: Oral ETT  Additional Equipment:   Intra-op Plan:   Post-operative Plan: Extubation in OR  Informed Consent: I have reviewed the patients History and Physical, chart, labs and discussed the procedure including the risks, benefits and alternatives for the proposed anesthesia with the patient or authorized representative who has indicated his/her understanding and acceptance.     Plan Discussed with: CRNA, Anesthesiologist and Surgeon  Anesthesia Plan Comments:         Anesthesia Quick Evaluation

## 2017-08-22 NOTE — Anesthesia Procedure Notes (Signed)
Procedure Name: Intubation Date/Time: 08/22/2017 7:47 AM Performed by: Pilar Grammes Pre-anesthesia Checklist: Patient identified, Emergency Drugs available, Suction available, Patient being monitored and Timeout performed Patient Re-evaluated:Patient Re-evaluated prior to induction Oxygen Delivery Method: Circle system utilized Preoxygenation: Pre-oxygenation with 100% oxygen Induction Type: IV induction Ventilation: Mask ventilation without difficulty Laryngoscope Size: Miller Grade View: Grade I Tube type: Oral Tube size: 7.5 mm Number of attempts: 1 Airway Equipment and Method: Stylet Placement Confirmation: positive ETCO2,  ETT inserted through vocal cords under direct vision,  CO2 detector and breath sounds checked- equal and bilateral Secured at: 22 cm Tube secured with: Tape Dental Injury: Teeth and Oropharynx as per pre-operative assessment

## 2017-08-22 NOTE — Brief Op Note (Signed)
f10/16/2018  10:25 AM  PATIENT:  Teresa Kidd  76 y.o. female  PRE-OPERATIVE DIAGNOSIS:  Cystocele with uterovaginal prolapse, rectocele  POST-OPERATIVE DIAGNOSIS:  Cystocele with uterovaginal prolapse, enlarged left ovary  PROCEDURE:  Total vaginal hysterectomy, BSO  SURGEON:  Surgeon(s) and Role: Panel 1:    * MacDiarmid, Nicki Reaper, MD - Primary  Panel 2:    * Servando Salina, MD - Primary  PHYSICIAN ASSISTANT:   ASSISTANTS: Bjorn Loser, MD   ANESTHESIA:   general Findings: large cystocele, uterovaginal prolapse, atrophic right ov, nl tubes, firm enlarged left ovary( ? Fibroma) EBL:  Total I/O In: 1000 [I.V.:1000] Out: 50 [Blood:50]  BLOOD ADMINISTERED:none  DRAINS: none   LOCAL MEDICATIONS USED:  OTHER lidocaine with epinephrine  SPECIMEN:  Source of Specimen:  uterus with cervix, nl tubes, atrophic right ov, firm enlarged left ovary  DISPOSITION OF SPECIMEN:  PATHOLOGY  COUNTS:  YES  TOURNIQUET:  * No tourniquets in log *  DICTATION: .Other Dictation: Dictation Number [id number]  PLAN OF CARE: Admit to inpatient   PATIENT DISPOSITION:  PACU - hemodynamically stable.   Delay start of Pharmacological VTE agent (>24hrs) due to surgical blood loss or risk of bleeding: no

## 2017-08-23 DIAGNOSIS — N814 Uterovaginal prolapse, unspecified: Secondary | ICD-10-CM | POA: Diagnosis not present

## 2017-08-23 LAB — BASIC METABOLIC PANEL
ANION GAP: 7 (ref 5–15)
BUN: 13 mg/dL (ref 6–20)
CHLORIDE: 105 mmol/L (ref 101–111)
CO2: 28 mmol/L (ref 22–32)
CREATININE: 0.99 mg/dL (ref 0.44–1.00)
Calcium: 8.6 mg/dL — ABNORMAL LOW (ref 8.9–10.3)
GFR calc non Af Amer: 54 mL/min — ABNORMAL LOW (ref >60)
Glucose, Bld: 127 mg/dL — ABNORMAL HIGH (ref 65–99)
POTASSIUM: 4.3 mmol/L (ref 3.5–5.1)
SODIUM: 140 mmol/L (ref 135–145)

## 2017-08-23 LAB — HEMOGLOBIN AND HEMATOCRIT, BLOOD
HEMATOCRIT: 32.9 % — AB (ref 36.0–46.0)
HEMOGLOBIN: 10.6 g/dL — AB (ref 12.0–15.0)

## 2017-08-23 NOTE — Discharge Summary (Signed)
[facility] Discharge Summary  Admit date: 08/22/2017  Discharge date and time: 08/23/17   Discharge to: Home  Discharge Service: Urology  Discharge Attending Physician:  Dr. Matilde Sprang  Discharge  Diagnoses: Cystocele, vault prolapse  Secondary Diagnosis: Active Problems:   Cystocele, midline   OR Procedures: Procedure(s): ANTERIOR (CYSTOCELE) WITH GRAFT CYSTOSCOPY VAGINAL VAULT PROLAPSE REPAIR TOTAL HYSTERECTOMY VAGINAL BILATERAL SALPINGO OOPHORECTOMY 08/22/2017   Ancillary Procedures: None   Discharge Day Services: The patient was seen and examined by the Urology team both in the morning and immediately prior to discharge.  Vital signs and laboratory values were stable and within normal limits.  The physical exam was benign and unchanged and all surgical wounds were examined.  Discharge instructions were explained and all questions answered.  Subjective  No acute events overnight. Pain Controlled. No fever or chills.  Objective Patient Vitals for the past 8 hrs:  BP Temp Temp src Pulse Resp SpO2  08/23/17 0600 (!) 98/51 98.4 F (36.9 C) Oral 60 18 95 %  08/23/17 0143 (!) 101/53 98.4 F (36.9 C) Oral 65 18 97 %   No intake/output data recorded.  General Appearance:        No acute distress Lungs:                       Normal work of breathing on room air Heart:                                Regular rate and rhythm Abdomen:                         Soft, non-tender, non-distended.  Extremities:                      Warm and well perfused   Hospital Course:  The patient underwent a total vaginal hysterectomy and bilateral salpingoophorectomy (with GYN), as well as an anterior cystocele repair and vault suspension with graft (Urology) on 08/22/2017.  The patient tolerated the procedure well, was extubated in the OR, and afterwards was taken to the PACU for routine post-surgical care. When stable the patient was transferred to the floor.   The patient did well  postoperatively.  The patient's diet was slowly advanced and at the time of discharge was tolerating a regular diet.  The patient was discharged home 1 Day Post-Op, at which point was tolerating a regular solid diet, was able to void spontaneously, have adequate pain control with P.O. pain medication, and could ambulate without difficulty. The patient will follow up with [place] for post op check.   Condition at Discharge: Improved  Discharge Medications:  Allergies as of 08/23/2017      Reactions   Feldene [piroxicam] Itching   In palm of hands & bottom of feets      Medication List    TAKE these medications   acetaminophen 500 MG tablet Commonly known as:  TYLENOL Take 1,000 mg by mouth every 6 (six) hours as needed for mild pain.   ADVANCED FIBER COMPLEX PO Take 4 tablets by mouth at bedtime.   CALCIUM CITRATE PO Take 600 mg by mouth 2 (two) times daily.   CENTRUM SILVER 50+WOMEN PO Take 1 tablet by mouth daily.   HAIR SKIN AND NAILS FORMULA PO Take 1 tablet by mouth 3 (three) times daily.   cetirizine 10 MG  tablet Commonly known as:  ZYRTEC Take 10 mg by mouth daily as needed for allergies.   denosumab 60 MG/ML Soln injection Commonly known as:  PROLIA Inject 60 mg into the skin every 6 (six) months. Administer in upper arm, thigh, or abdomen  Next dose due 11/2017   DULoxetine 30 MG capsule Commonly known as:  CYMBALTA Take 30 mg by mouth daily.   gabapentin 600 MG tablet Commonly known as:  NEURONTIN Take 600 mg by mouth 3 (three) times daily.   HYDROcodone-acetaminophen 5-325 MG tablet Commonly known as:  NORCO Take 1-2 tablets by mouth every 6 (six) hours as needed for moderate pain or severe pain.   hydroxychloroquine 200 MG tablet Commonly known as:  PLAQUENIL Take 200 mg by mouth daily.   letrozole 2.5 MG tablet Commonly known as:  FEMARA Take 1 tablet by mouth daily.   levothyroxine 75 MCG tablet Commonly known as:  SYNTHROID, LEVOTHROID Take 75 mcg  by mouth daily.   lisinopril 5 MG tablet Commonly known as:  PRINIVIL,ZESTRIL Take 5 mg by mouth daily.   Magnesium Oxide 500 MG Caps Take 500 mg by mouth daily.   omeprazole 20 MG capsule Commonly known as:  PRILOSEC Take 20 mg by mouth daily.   prednisoLONE 5 MG Tabs tablet Take 5 mg by mouth daily.   tiZANidine 4 MG tablet Commonly known as:  ZANAFLEX Take 4 mg by mouth at bedtime.   tiZANidine 2 MG tablet Commonly known as:  ZANAFLEX Take 1 mg by mouth 3 (three) times daily. Takes 0.5 tablet 3 times daily   Vitamin D3 1000 units Caps Take 1 capsule by mouth daily.   D 2000 2000 units Tabs Generic drug:  Cholecalciferol Take 2,000 Units by mouth daily.

## 2017-08-23 NOTE — Progress Notes (Signed)
Patient is alert and oriented, vital signs are stable, packing removed, patient has voided adequately, patient is tolerating her diet without complaints of nausea or vomiting., discharge instructions reviewed with patient, questions answered Neta Mends RN 11:21 AM 08-23-2017

## 2017-08-23 NOTE — Progress Notes (Signed)
Looks good Vital and labs normal No pain no leg pain Op findings described See orders Restart blood thinner- speak to Dd Garwin Brothers

## 2017-08-23 NOTE — Progress Notes (Signed)
GYN service POD#1 s/p TVH BSO, cystocele, vault suspension  S: no complaint. Feels great Foley and packing out. Reviewed intraop findings again  O: BP (!) 98/51 (BP Location: Right Arm)   Pulse 60   Temp 98.4 F (36.9 C) (Oral)   Resp 18   Ht 5' (1.524 m)   Wt 63 kg (139 lb)   SpO2 95%   BMI 27.15 kg/m  Lungs clear to A  Back no CVAT Cor RRR Abdomen: soft nondistended NT Pad none Extr. No edema or calf tenderness (+) stockings  Path pending CBC    Component Value Date/Time   WBC 9.0 08/17/2017 1445   RBC 3.95 08/17/2017 1445   HGB 10.6 (L) 08/23/2017 0518   HCT 32.9 (L) 08/23/2017 0518   PLT 231 08/17/2017 1445   MCV 97.0 08/17/2017 1445   MCH 31.4 08/17/2017 1445   MCHC 32.4 08/17/2017 1445   RDW 14.4 08/17/2017 1445   . BMP Latest Ref Rng & Units 08/23/2017 08/17/2017 10/24/2016  Glucose 65 - 99 mg/dL 127(H) 107(H) 97  BUN 6 - 20 mg/dL 13 19 28(H)  Creatinine 0.44 - 1.00 mg/dL 0.99 1.15(H) 1.07(H)  Sodium 135 - 145 mmol/L 140 140 138  Potassium 3.5 - 5.1 mmol/L 4.3 4.3 4.3  Chloride 101 - 111 mmol/L 105 103 106  CO2 22 - 32 mmol/L 28 28 24   Calcium 8.9 - 10.3 mg/dL 8.6(L) 9.6 9.8  IMP: S?P TVHBSO, cystocele, vault suspension  POD # 1 doing well Hx DVT pt was seen by her heme/onc P) voiding trials this am. Gyn instructions reviewed. F/u 6 weeks Dr Garwin Brothers( our office will call pt) D/c home per Dr Matilde Sprang Await path. Instructed pt to start  xarelto today when she goes home( has med at home already)

## 2017-08-23 NOTE — Op Note (Signed)
NAME:  Teresa Kidd, Teresa Kidd                      ACCOUNT NO.:  MEDICAL RECORD NO.:  [id number]  LOCATION:                                 FACILITY:  PHYSICIAN:  Servando Salina, M.D.    DATE OF BIRTH:  DATE OF PROCEDURE:  08/22/2017 DATE OF DISCHARGE:                              OPERATIVE REPORT   PREOPERATIVE DIAGNOSES: 1. Cystocele. 2. Small rectocele. 3. Uterovaginal prolapse. 4. Vaginal Vault prolapse.  PROCEDURES: 1. Total vaginal hysterectomy. 2. Bilateral salpingo-oophorectomy.  POSTOPERATIVE DIAGNOSES: 1. Cystocele. 2. Uterovaginal prolapse. 3: enlarged left ovary  ANESTHESIA:  General.  SURGEON:  Servando Salina, M.D.  ASSISTANT:  Reece Packer, MD  DESCRIPTION OF PROCEDURE:  Under adequate general anesthesia, the patient was placed in dorsal lithotomy position.  She was positioned prior to being sterilely prepped and draped.  Foley catheter without the bag was placed.  Weighted speculum was placed in the vagina.  Sims retractor was placed anteriorly.  Anterior and posterior lips were grasped with a Jacobson clamp.  The cervicovaginal junction was identified and dilute solution of 1% lidocaine with epinephrine was injected at that site.  Circumferential incision was made at that junction with dissection, the posterior cul-de-sac was opened.  The vaginal cuff at that point was oversewn with 0 Vicryl running locked stitch.  The retractor was then placed within the pelvic cavity. Uterosacral ligaments were bilaterally clamped, cut, and suture ligated with 0 Vicryl suture.  After prolonged dissection anteriorly, the anterior peritoneum was not seen.  Using the LigaSure, cardinal ligaments were bilaterally clamped, cauterized, and then cut.  The fundus of the uterus was grasped posteriorly and brought forward with identification of the vesicouterine peritoneum on the opposite side that was then opened transversely and the retractor was placed with displacing the  bladder anteriorly.  The uterine arteries were then bilaterally clamped, cauterized and cut. The dissection was continued until the round ligaments were reached.  At the same time, as the fundus was being flipped, the left ovary was noted to be enlarged and firm, suggestive of a possible fibroma.  The lateral walls were retracted and the opposite ovary which was small was identified,  to the IP ligament.  The IP was clamped, cauterized, and then cut until the right tube and ovary were removed, allowing for then better visualization and removal of the IP on the opposite side with the uterus, tubes, and ovaries all intact.  The pedicles were hemostatic.  At that point, the procedure was taken over by Dr. Matilde Sprang, who performed a cystoscopy, confirming both ureteral jets were  Both opened and then continued his cystocele repair and vault suspension, please see his operative report for the details.  SPECIMENS:  Uterus, cervix with both tubes, right ovaries, left ovary being identified as enlarged, all sent to Pathology.  ESTIMATED BLOOD LOSS:  50 mL.  INTRAOPERATIVE FLUID:  1000 mL.  Sponge and instrument counts x2 were correct.  COMPLICATIONS:  None.  The patient tolerated the procedure well, was transferred to recovery room in stable condition ultimately.     Servando Salina, M.D.     Ty Ty/MEDQ  D:  08/22/2017  T:  08/23/2017  Job:  [id number]

## 2017-08-23 NOTE — Anesthesia Postprocedure Evaluation (Signed)
Anesthesia Post Note  Patient: Teresa Kidd  Procedure(s) Performed: ANTERIOR (CYSTOCELE) WITH GRAFT (N/A ) CYSTOSCOPY (N/A ) VAGINAL VAULT PROLAPSE REPAIR (N/A ) TOTAL HYSTERECTOMY VAGINAL (N/A ) BILATERAL SALPINGO OOPHORECTOMY (Bilateral )     Patient location during evaluation: PACU Anesthesia Type: General Level of consciousness: awake and alert Pain management: pain level controlled Vital Signs Assessment: post-procedure vital signs reviewed and stable Respiratory status: spontaneous breathing, nonlabored ventilation, respiratory function stable and patient connected to nasal cannula oxygen Cardiovascular status: blood pressure returned to baseline and stable Postop Assessment: no apparent nausea or vomiting Anesthetic complications: no    Last Vitals:  Vitals:   08/23/17 0143 08/23/17 0600  BP: (!) 101/53 (!) 98/51  Pulse: 65 60  Resp: 18 18  Temp: 36.9 C 36.9 C  SpO2: 97% 95%    Last Pain:  Vitals:   08/23/17 0818  TempSrc:   PainSc: Asleep                 Almon Whitford S

## 2017-08-28 ENCOUNTER — Encounter (HOSPITAL_COMMUNITY): Payer: Self-pay | Admitting: Urology

## 2021-02-08 ENCOUNTER — Encounter (HOSPITAL_COMMUNITY): Payer: Self-pay

## 2021-02-08 NOTE — Patient Instructions (Addendum)
DUE TO COVID-19 ONLY    ONE   VISITOR IS ALLOWED TO COME WITH YOU AND STAY IN THE      WAITING ROOM ONLY       DURING PRE OP AND PROCEDURE DAY OF SURGERY.   TWO VISITOR  MAY VISIT WITH YOU AFTER SURGERY IN YOUR PRIVATE ROOM DURING VISITING HOURS ONLY!  YOU NEED TO HAVE A COVID 19 TEST ON__4-15-22_____ @_______ , THIS TEST MUST BE DONE BEFORE SURGERY,  COVID TESTING SITE [address] [place] [address],   IT IS ON THE RIGHT GOING OUT [address] APPROXIMATELY  2 MINUTES PAST [employer] ON THE RIGHT. ONCE YOUR COVID TEST IS COMPLETED,  PLEASE BEGIN THE QUARANTINE INSTRUCTIONS AS OUTLINED IN YOUR HANDOUT.                Florrie Ramires  02/08/2021   Your procedure is scheduled on: 02-23-21   Report to [facility] Main  Entrance   Report to short stay  at       [place]   AM     Call this number if you have problems the morning of surgery [phone]    Remember: NO SOLID FOOD AFTER MIDNIGHT THE NIGHT PRIOR TO SURGERY. NOTHING BY MOUTH EXCEPT CLEAR LIQUIDS UNTIL    0415 am.   PLEASE FINISH ENSURE DRINK PER SURGEON ORDER  WHICH NEEDS TO BE COMPLETED AT      [place] am then nothing by mouth.    CLEAR LIQUID DIET   Foods Allowed                                                                                        Foods Excluded  Black Coffee and tea, regular and decaf                             liquids that you cannot  Plain Jell-O any favor except red or purple                                           see through such as: Fruit ices (not with fruit pulp)                                                      milk, soups, orange juice  Iced Popsicles                                                         All solid food Carbonated beverages, regular and diet  Cranberry, grape and apple juices Sports drinks like Gatorade Lightly seasoned clear broth or consume(fat free) Sugar, honey  syrup   _____________________________________________________________________     BRUSH YOUR TEETH MORNING OF SURGERY AND RINSE YOUR MOUTH OUT, NO CHEWING GUM CANDY OR MINTS.     Take these medicines the morning of surgery with A SIP OF WATER: zocor, omeprazole, levothyroxine, hydroxychloroquine, gabapentin, cymbalta  DO NOT TAKE ANY DIABETIC MEDICATIONS DAY OF YOUR SURGERY                               You may not have any metal on your body including hair pins and              piercings  Do not wear jewelry, make-up, lotions, powders or perfumes, deodorant             Do not wear nail polish on your fingernails.  Do not shave  48 hours prior to surgery.             Do not bring valuables to the hospital. [place].  Contacts, dentures or bridgework may not be worn into surgery.      Patients discharged the day of surgery will not be allowed to drive home. IF YOU ARE HAVING SURGERY AND GOING HOME THE SAME DAY, YOU MUST HAVE AN ADULT TO DRIVE YOU HOME AND BE WITH YOU FOR 24 HOURS. YOU MAY GO HOME BY TAXI OR UBER OR ORTHERWISE, BUT AN ADULT MUST ACCOMPANY YOU HOME AND STAY WITH YOU FOR 24 HOURS.  Name and phone number of your driver:  Special Instructions: N/A              Please read over the following fact sheets you were given: _____________________________________________________________________             [facility] - Preparing for Surgery Before surgery, you can play an important role.  Because skin is not sterile, your skin needs to be as free of germs as possible.  You can reduce the number of germs on your skin by washing with CHG (chlorahexidine gluconate) soap before surgery.  CHG is an antiseptic cleaner which kills germs and bonds with the skin to continue killing germs even after washing. Please DO NOT use if you have an allergy to CHG or antibacterial soaps.  If your skin becomes reddened/irritated stop using the  CHG and inform your nurse when you arrive at Short Stay. Do not shave (including legs and underarms) for at least 48 hours prior to the first CHG shower.  You may shave your face/neck. Please follow these instructions carefully:  1.  Shower with CHG Soap the night before surgery and the  morning of Surgery.  2.  If you choose to wash your hair, wash your hair first as usual with your  normal  shampoo.  3.  After you shampoo, rinse your hair and body thoroughly to remove the  shampoo.                           4.  Use CHG as you would any other liquid soap.  You can apply chg directly  to the skin and wash  Gently with a scrungie or clean washcloth.  5.  Apply the CHG Soap to your body ONLY FROM THE NECK DOWN.   Do not use on face/ open                           Wound or open sores. Avoid contact with eyes, ears mouth and genitals (private parts).                       Wash face,  Genitals (private parts) with your normal soap.             6.  Wash thoroughly, paying special attention to the area where your surgery  will be performed.  7.  Thoroughly rinse your body with warm water from the neck down.  8.  DO NOT shower/wash with your normal soap after using and rinsing off  the CHG Soap.                9.  Pat yourself dry with a clean towel.            10.  Wear clean pajamas.            11.  Place clean sheets on your bed the night of your first shower and do not  sleep with pets. Day of Surgery : Do not apply any lotions/deodorants the morning of surgery.  Please wear clean clothes to the hospital/surgery center.  FAILURE TO FOLLOW THESE INSTRUCTIONS MAY RESULT IN THE CANCELLATION OF YOUR SURGERY PATIENT SIGNATURE_________________________________  NURSE SIGNATURE__________________________________  ________________________________________________________________________   Adam Phenix  An incentive spirometer is a tool that can help keep your lungs clear  and active. This tool measures how well you are filling your lungs with each breath. Taking long deep breaths may help reverse or decrease the chance of developing breathing (pulmonary) problems (especially infection) following:  A long period of time when you are unable to move or be active. BEFORE THE PROCEDURE   If the spirometer includes an indicator to show your best effort, your nurse or respiratory therapist will set it to a desired goal.  If possible, sit up straight or lean slightly forward. Try not to slouch.  Hold the incentive spirometer in an upright position. INSTRUCTIONS FOR USE  1. Sit on the edge of your bed if possible, or sit up as far as you can in bed or on a chair. 2. Hold the incentive spirometer in an upright position. 3. Breathe out normally. 4. Place the mouthpiece in your mouth and seal your lips tightly around it. 5. Breathe in slowly and as deeply as possible, raising the piston or the ball toward the top of the column. 6. Hold your breath for 3-5 seconds or for as long as possible. Allow the piston or ball to fall to the bottom of the column. 7. Remove the mouthpiece from your mouth and breathe out normally. 8. Rest for a few seconds and repeat Steps 1 through 7 at least 10 times every 1-2 hours when you are awake. Take your time and take a few normal breaths between deep breaths. 9. The spirometer may include an indicator to show your best effort. Use the indicator as a goal to work toward during each repetition. 10. After each set of 10 deep breaths, practice coughing to be sure your lungs are clear. If you have an incision (the cut made at the time of surgery),  support your incision when coughing by placing a pillow or rolled up towels firmly against it. Once you are able to get out of bed, walk around indoors and cough well. You may stop using the incentive spirometer when instructed by your caregiver.  RISKS AND COMPLICATIONS  Take your time so you do not  get dizzy or light-headed.  If you are in pain, you may need to take or ask for pain medication before doing incentive spirometry. It is harder to take a deep breath if you are having pain. AFTER USE  Rest and breathe slowly and easily.  It can be helpful to keep track of a log of your progress. Your caregiver can provide you with a simple table to help with this. If you are using the spirometer at home, follow these instructions: [place] IF:   You are having difficultly using the spirometer.  You have trouble using the spirometer as often as instructed.  Your pain medication is not giving enough relief while using the spirometer.  You develop fever of 100.5 F (38.1 C) or higher. SEEK IMMEDIATE MEDICAL CARE IF:   You cough up bloody sputum that had not been present before.  You develop fever of 102 F (38.9 C) or greater.  You develop worsening pain at or near the incision site. MAKE SURE YOU:   Understand these instructions.  Will watch your condition.  Will get help right away if you are not doing well or get worse. Document Released: 03/06/2007 Document Revised: 01/16/2012 Document Reviewed: 05/07/2007 [facility] Patient Information 2014 [place], [place].   ________________________________________________________________________

## 2021-02-10 ENCOUNTER — Encounter (HOSPITAL_COMMUNITY)
Admission: RE | Admit: 2021-02-10 | Discharge: 2021-02-10 | Disposition: A | Discharge status: Home / Self Care | Payer: Medicare Other | Source: Ambulatory Visit | Attending: Orthopedic Surgery | Admitting: Orthopedic Surgery

## 2021-02-10 ENCOUNTER — Other Ambulatory Visit: Payer: Self-pay

## 2021-02-10 ENCOUNTER — Encounter (HOSPITAL_COMMUNITY): Payer: Self-pay

## 2021-02-10 DIAGNOSIS — Z01818 Encounter for other preprocedural examination: Secondary | ICD-10-CM | POA: Diagnosis present

## 2021-02-10 HISTORY — DX: Family history of other specified conditions: Z84.89

## 2021-02-10 HISTORY — DX: Dehydration: E86.0

## 2021-02-10 LAB — CBC
HCT: 39.2 % (ref 36.0–46.0)
Hemoglobin: 12.2 g/dL (ref 12.0–15.0)
MCH: 31.6 pg (ref 26.0–34.0)
MCHC: 31.1 g/dL (ref 30.0–36.0)
MCV: 101.6 fL — ABNORMAL HIGH (ref 80.0–100.0)
Platelets: 217 10*3/uL (ref 150–400)
RBC: 3.86 MIL/uL — ABNORMAL LOW (ref 3.87–5.11)
RDW: 13.8 % (ref 11.5–15.5)
WBC: 9.7 10*3/uL (ref 4.0–10.5)
nRBC: 0 % (ref 0.0–0.2)

## 2021-02-10 LAB — COMPREHENSIVE METABOLIC PANEL
ALT: 19 U/L (ref 0–44)
AST: 24 U/L (ref 15–41)
Albumin: 4.2 g/dL (ref 3.5–5.0)
Alkaline Phosphatase: 45 U/L (ref 38–126)
Anion gap: 9 (ref 5–15)
BUN: 21 mg/dL (ref 8–23)
CO2: 26 mmol/L (ref 22–32)
Calcium: 10.5 mg/dL — ABNORMAL HIGH (ref 8.9–10.3)
Chloride: 109 mmol/L (ref 98–111)
Creatinine, Ser: 1.22 mg/dL — ABNORMAL HIGH (ref 0.44–1.00)
GFR, Estimated: 45 mL/min — ABNORMAL LOW (ref >60)
Glucose, Bld: 101 mg/dL — ABNORMAL HIGH (ref 70–99)
Potassium: 4.7 mmol/L (ref 3.5–5.1)
Sodium: 144 mmol/L (ref 135–145)
Total Bilirubin: 0.6 mg/dL (ref 0.3–1.2)
Total Protein: 7.2 g/dL (ref 6.5–8.1)

## 2021-02-10 LAB — TYPE AND SCREEN
ABO/RH(D): A NEG
Antibody Screen: NEGATIVE

## 2021-02-10 LAB — PROTIME-INR
INR: 0.9 (ref 0.8–1.2)
Prothrombin Time: 11.7 seconds (ref 11.4–15.2)

## 2021-02-10 LAB — APTT: aPTT: 27 seconds (ref 24–36)

## 2021-02-10 NOTE — Progress Notes (Addendum)
PCP -  Tamera Punt, MD  [facility] Cardiologist - Dr. Alroy Dust, MD [place] Orders -  Rep Notified -   Chest x-ray -  EKG -02-08-21 on chart Stress Test -  ECHO -  Cardiac Cath -   Sleep Study -  CPAP -   Fasting Blood Sugar -  Checks Blood Sugar _____ times a day  Blood Thinner Instructions: Aspirin Instructions:  ERAS Protcol - PRE-SURGERY Ensure or G2-   COVID TEST- 02-19-21 Activity-- Limited do to hip pain  Does ADLs without SOB   Anesthesia review: DVT 2017, HTN  Patient denies shortness of breath, fever, cough and chest pain at PAT appointment   All instructions explained to the patient, with a verbal understanding of the material. Patient agrees to go over the instructions while at home for a better understanding. Patient also instructed to self quarantine after being tested for COVID-19. The opportunity to ask questions was provided.

## 2021-02-11 LAB — SURGICAL PCR SCREEN
MRSA, PCR: POSITIVE — AB
Staphylococcus aureus: POSITIVE — AB

## 2021-02-19 ENCOUNTER — Other Ambulatory Visit (HOSPITAL_COMMUNITY)
Admission: RE | Admit: 2021-02-19 | Discharge: 2021-02-19 | Disposition: A | Discharge status: Home / Self Care | Payer: Medicare Other | Source: Ambulatory Visit | Attending: Orthopedic Surgery | Admitting: Orthopedic Surgery

## 2021-02-19 DIAGNOSIS — Z01812 Encounter for preprocedural laboratory examination: Secondary | ICD-10-CM | POA: Diagnosis present

## 2021-02-19 DIAGNOSIS — Z20822 Contact with and (suspected) exposure to covid-19: Secondary | ICD-10-CM | POA: Diagnosis not present

## 2021-02-19 LAB — SARS CORONAVIRUS 2 (TAT 6-24 HRS): SARS Coronavirus 2: NEGATIVE

## 2021-02-22 NOTE — Anesthesia Preprocedure Evaluation (Addendum)
Anesthesia Evaluation  Patient identified by MRN, date of birth, ID band Patient awake    Reviewed: Allergy & Precautions, H&P , NPO status , Patient's Chart, lab work & pertinent test results  History of Anesthesia Complications (+) PONV, Family history of anesthesia reaction and history of anesthetic complications  Airway Mallampati: II  TM Distance: >3 FB Neck ROM: Full    Dental no notable dental hx. (+) Teeth Intact, Poor Dentition, Missing, Caps, Partial Upper, Partial Lower   Pulmonary    Pulmonary exam normal breath sounds clear to auscultation       Cardiovascular Exercise Tolerance: Good hypertension, Pt. on medications + DVT  Normal cardiovascular exam Rhythm:Regular Rate:Normal     Neuro/Psych    GI/Hepatic GERD  Medicated and Controlled,  Endo/Other  Hypothyroidism   Renal/GU      Musculoskeletal  (+) Arthritis , Osteoarthritis,  Fibromyalgia -  Abdominal   Peds  Hematology   Anesthesia Other Findings   Reproductive/Obstetrics                            Anesthesia Physical Anesthesia Plan  ASA: III  Anesthesia Plan: General   Post-op Pain Management:    Induction: Intravenous  PONV Risk Score and Plan: 2 and Propofol infusion, Ondansetron, Dexamethasone and Treatment may vary due to age or medical condition  Airway Management Planned: Oral ETT and LMA  Additional Equipment: None  Intra-op Plan:   Post-operative Plan: Extubation in OR  Informed Consent: I have reviewed the patients History and Physical, chart, labs and discussed the procedure including the risks, benefits and alternatives for the proposed anesthesia with the patient or authorized representative who has indicated his/her understanding and acceptance.       Plan Discussed with: Anesthesiologist and CRNA  Anesthesia Plan Comments: (  )       Anesthesia Quick Evaluation

## 2021-02-22 NOTE — H&P (Signed)
TOTAL HIP ADMISSION H&P  Patient is admitted for right total hip arthroplasty.  Subjective:  Chief Complaint: right hip pain  HPI: Teresa Kidd, 80 y.o. female, has a history of pain and functional disability in the right hip(s) due to arthritis and patient has failed non-surgical conservative treatments for greater than 12 weeks to include NSAID's and/or analgesics and activity modification.  Onset of symptoms was gradual starting 2 years ago with gradually worsening course since that time.The patient noted no past surgery on the right hip(s).  Patient currently rates pain in the right hip at 8 out of 10 with activity. Patient has worsening of pain with activity and weight bearing, pain that interfers with activities of daily living and pain with passive range of motion. Patient has evidence of joint space narrowing by imaging studies. This condition presents safety issues increasing the risk of falls.  There is no current active infection.  Patient Active Problem List   Diagnosis Date Noted  . Cystocele, midline 08/22/2017   Past Medical History:  Diagnosis Date  . Arthritis   . Cancer ([place]) 2014   Breast CA Left   . Dehydration    with AKI  . DVT (deep venous thrombosis) ([place]) 01/18/2016   left leg  . Family history of adverse reaction to anesthesia   . GERD (gastroesophageal reflux disease)   . History of kidney stones 2006-2007  . Hypertension   . Hypothyroidism   . PONV (postoperative nausea and vomiting)    motion sickness    Past Surgical History:  Procedure Laterality Date  . APPENDECTOMY    . BACK SURGERY  1994   herniated disc, DJD , Stabalizers , Spinal stenosis surgery  . BREAST SURGERY     lumpectomy left breast  . CARPAL TUNNEL RELEASE  1985  . CHOLECYSTECTOMY  1976  . COLONOSCOPY     polyps removed  . CYSTOCELE REPAIR N/A 08/22/2017   Procedure: ANTERIOR (CYSTOCELE) WITH GRAFT;  Surgeon: Bjorn Loser, MD;  Location: [facility];  Service: Urology;   Laterality: N/A;  . CYSTOSCOPY N/A 08/22/2017   Procedure: CYSTOSCOPY;  Surgeon: Bjorn Loser, MD;  Location: [facility];  Service: Urology;  Laterality: N/A;  . DILATATION & CURETTAGE/HYSTEROSCOPY WITH MYOSURE N/A 10/24/2016   Procedure: DILATATION & CURETTAGE/HYSTEROSCOPY WITH MYOSURE;  Surgeon: Servando Salina, MD;  Location: [facility];  Service: Gynecology;  Laterality: N/A;  . ROTATOR CUFF REPAIR    . SALPINGOOPHORECTOMY Bilateral 08/22/2017   Procedure: BILATERAL SALPINGO OOPHORECTOMY;  Surgeon: Servando Salina, MD;  Location: [facility];  Service: Gynecology;  Laterality: Bilateral;  . THYROIDECTOMY  1985  . VAGINAL HYSTERECTOMY N/A 08/22/2017   Procedure: TOTAL HYSTERECTOMY VAGINAL;  Surgeon: Servando Salina, MD;  Location: [facility];  Service: Gynecology;  Laterality: N/A;  . VAGINAL PROLAPSE REPAIR N/A 08/22/2017   Procedure: VAGINAL VAULT PROLAPSE REPAIR;  Surgeon: Bjorn Loser, MD;  Location: [facility];  Service: Urology;  Laterality: N/A;    No current facility-administered medications for this encounter.   Current Outpatient Medications  Medication Sig Dispense Refill Last Dose  . acetaminophen (TYLENOL) 500 MG tablet Take 1,500 mg by mouth 2 (two) times daily as needed for moderate pain.     . Calcium Carb-Cholecalciferol (CALCIUM 600+D) 600-800 MG-UNIT TABS Take 1 tablet by mouth daily.     . Cholecalciferol (VITAMIN D3) 1000 units CAPS Take 1,000 Units by mouth daily.     . Cyanocobalamin (B-12) 5000 MCG CAPS Take 5,000 mcg by mouth daily.     Marland Kitchen  denosumab (PROLIA) 60 MG/ML SOLN injection Inject 60 mg into the skin every 6 (six) months. Administer in upper arm, thigh, or abdomen  Next dose due 11/2017     . DULoxetine (CYMBALTA) 30 MG capsule Take 30 mg by mouth daily.     . folic acid (FOLVITE) 814 MCG tablet Take 800 mcg by mouth daily.     Marland Kitchen gabapentin (NEURONTIN) 600 MG tablet Take 600 mg by mouth 3 (three) times daily.     . Ginger, Zingiber officinalis, (GINGER  ROOT) 550 MG CAPS Take 550 mg by mouth daily.     . hydroxychloroquine (PLAQUENIL) 200 MG tablet Take 200 mg by mouth daily.     Marland Kitchen levothyroxine (SYNTHROID, LEVOTHROID) 75 MCG tablet Take 75 mcg by mouth daily before breakfast.     . lisinopril (PRINIVIL,ZESTRIL) 5 MG tablet Take 5 mg by mouth daily.     . Magnesium 250 MG TABS Take 250 mg by mouth daily.     . Multiple Vitamins-Minerals (CENTRUM SILVER 50+WOMEN PO) Take 1 tablet by mouth daily.     Marland Kitchen omeprazole (PRILOSEC) 20 MG capsule Take 20 mg by mouth daily.     . predniSONE (DELTASONE) 5 MG tablet Take 5 mg by mouth daily with breakfast.     . simvastatin (ZOCOR) 40 MG tablet Take 40 mg by mouth daily.     . Turmeric Curcumin 500 MG CAPS Take 500 mg by mouth daily.      Allergies  Allergen Reactions  . Feldene [Piroxicam] Itching    In palm of hands & bottom of feets    Social History   Tobacco Use  . Smoking status: Never Smoker  . Smokeless tobacco: Never Used  Substance Use Topics  . Alcohol use: No    No family history on file.   Review of Systems  Constitutional: Negative for chills and fever.  Respiratory: Negative for cough and shortness of breath.   Cardiovascular: Negative for chest pain.  Gastrointestinal: Negative for nausea and vomiting.  Musculoskeletal: Positive for arthralgias.    Objective:  Physical Exam Well nourished and well developed. General: Alert and oriented x3, cooperative and pleasant, no acute distress. Head: normocephalic, atraumatic, neck supple. Eyes: EOMI.  Musculoskeletal:  Right hip exam: Very limited and painful hip range of motion with hip flexion internal rotation just beyond neutral and external rotation close to 20 degrees with reproducible pain in the hip girdle. Neurovascular intact distally without lower extremity edema or erythema In comparison her left hip range of motion is more fluid without significant discomfort with hip flexion internal rotation to 20 degrees and  external rotation at 30 degrees  Calves soft and nontender. Motor function intact in LE. Strength 5/5 LE bilaterally. Neuro: Distal pulses 2+. Sensation to light touch intact in LE.  Vital signs in last 24 hours:    Labs:   Estimated body mass index is 26.37 kg/m as calculated from the following:   Height as of 02/10/21: 5' (1.524 m).   Weight as of 02/10/21: 61.2 kg.   Imaging Review Plain radiographs demonstrate severe degenerative joint disease of the right hip(s). The bone quality appears to be adequate for age and reported activity level.      Assessment/Plan:  End stage arthritis, right hip(s)  The patient history, physical examination, clinical judgement of the provider and imaging studies are consistent with end stage degenerative joint disease of the right hip(s) and total hip arthroplasty is deemed medically necessary. The treatment options  including medical management, injection therapy, arthroscopy and arthroplasty were discussed at length. The risks and benefits of total hip arthroplasty were presented and reviewed. The risks due to aseptic loosening, infection, stiffness, dislocation/subluxation,  thromboembolic complications and other imponderables were discussed.  The patient acknowledged the explanation, agreed to proceed with the plan and consent was signed. Patient is being admitted for inpatient treatment for surgery, pain control, PT, OT, prophylactic antibiotics, VTE prophylaxis, progressive ambulation and ADL's and discharge planning.The patient is planning to be discharged home.  Therapy Plans: HEP Disposition: Home with son & caregiver Mateo Flow Planned DVT Prophylaxis: ASA 81 mg BID (Xarelto was too costly), hx of DVT DME needed: none PCP: Dr. Amedeo Gory, clearance received TXA: IV Allergies: NKDA Anesthesia Concerns: BMI: 26.4 Not diabetic.  Other: - Hx of DVT in March 2017 - Has Percocet 5 mg at home from spinal surgery in November 2021 that she may use  sparingly - no NSAIDs, kidney issues - Takes Prednisone 5 mg daily   Griffith Citron, PA-C Orthopedic Surgery [facility] [place] [phone]

## 2021-02-23 ENCOUNTER — Inpatient Hospital Stay (HOSPITAL_COMMUNITY)
Admission: RE | Admit: 2021-02-23 | Discharge: 2021-02-25 | DRG: 470 | Disposition: A | Discharge status: Home / Self Care | Payer: Medicare Other | Attending: Orthopedic Surgery | Admitting: Orthopedic Surgery

## 2021-02-23 ENCOUNTER — Ambulatory Visit (HOSPITAL_COMMUNITY): Payer: Medicare Other | Admitting: Anesthesiology

## 2021-02-23 ENCOUNTER — Encounter (HOSPITAL_COMMUNITY)
Admission: RE | Disposition: A | Discharge status: Home / Self Care | Payer: Self-pay | Source: Home / Self Care | Attending: Orthopedic Surgery

## 2021-02-23 ENCOUNTER — Ambulatory Visit (HOSPITAL_COMMUNITY): Payer: Medicare Other

## 2021-02-23 ENCOUNTER — Encounter (HOSPITAL_COMMUNITY): Payer: Self-pay | Admitting: Orthopedic Surgery

## 2021-02-23 ENCOUNTER — Other Ambulatory Visit: Payer: Self-pay

## 2021-02-23 DIAGNOSIS — Z888 Allergy status to other drugs, medicaments and biological substances status: Secondary | ICD-10-CM

## 2021-02-23 DIAGNOSIS — Z419 Encounter for procedure for purposes other than remedying health state, unspecified: Secondary | ICD-10-CM

## 2021-02-23 DIAGNOSIS — Z853 Personal history of malignant neoplasm of breast: Secondary | ICD-10-CM

## 2021-02-23 DIAGNOSIS — M1611 Unilateral primary osteoarthritis, right hip: Principal | ICD-10-CM | POA: Diagnosis present

## 2021-02-23 DIAGNOSIS — Z86718 Personal history of other venous thrombosis and embolism: Secondary | ICD-10-CM

## 2021-02-23 DIAGNOSIS — E890 Postprocedural hypothyroidism: Secondary | ICD-10-CM | POA: Diagnosis present

## 2021-02-23 DIAGNOSIS — Z96649 Presence of unspecified artificial hip joint: Secondary | ICD-10-CM

## 2021-02-23 DIAGNOSIS — Z9181 History of falling: Secondary | ICD-10-CM

## 2021-02-23 DIAGNOSIS — K219 Gastro-esophageal reflux disease without esophagitis: Secondary | ICD-10-CM | POA: Diagnosis present

## 2021-02-23 DIAGNOSIS — Z90710 Acquired absence of both cervix and uterus: Secondary | ICD-10-CM

## 2021-02-23 DIAGNOSIS — Z7952 Long term (current) use of systemic steroids: Secondary | ICD-10-CM

## 2021-02-23 DIAGNOSIS — Z79899 Other long term (current) drug therapy: Secondary | ICD-10-CM

## 2021-02-23 DIAGNOSIS — Z87442 Personal history of urinary calculi: Secondary | ICD-10-CM

## 2021-02-23 DIAGNOSIS — Z79890 Hormone replacement therapy (postmenopausal): Secondary | ICD-10-CM

## 2021-02-23 DIAGNOSIS — I10 Essential (primary) hypertension: Secondary | ICD-10-CM | POA: Diagnosis present

## 2021-02-23 HISTORY — PX: TOTAL HIP ARTHROPLASTY: SHX124

## 2021-02-23 SURGERY — ARTHROPLASTY, HIP, TOTAL, ANTERIOR APPROACH
Anesthesia: General | Site: Hip | Laterality: Right

## 2021-02-23 MED ORDER — ALBUMIN HUMAN 5 % IV SOLN
INTRAVENOUS | Status: AC
  Filled 2021-02-23: qty 250

## 2021-02-23 MED ORDER — HYDROXYCHLOROQUINE SULFATE 200 MG PO TABS
200.0000 mg | ORAL_TABLET | Freq: Every day | ORAL | Status: DC
  Administered 2021-02-24 – 2021-02-25 (×2): 200 mg via ORAL
  Filled 2021-02-23 (×2): qty 1

## 2021-02-23 MED ORDER — ACETAMINOPHEN 325 MG PO TABS: 325.0000 mg | ORAL_TABLET | Freq: Four times a day (QID) | ORAL | Status: DC | PRN

## 2021-02-23 MED ORDER — ONDANSETRON HCL 4 MG/2ML IJ SOLN
INTRAMUSCULAR | Status: DC | PRN
  Administered 2021-02-23: 4 mg via INTRAVENOUS

## 2021-02-23 MED ORDER — DOCUSATE SODIUM 100 MG PO CAPS
100.0000 mg | ORAL_CAPSULE | Freq: Two times a day (BID) | ORAL | Status: DC
  Administered 2021-02-23 – 2021-02-25 (×4): 100 mg via ORAL
  Filled 2021-02-23 (×4): qty 1

## 2021-02-23 MED ORDER — PROPOFOL 1000 MG/100ML IV EMUL
INTRAVENOUS | Status: AC
  Filled 2021-02-23: qty 100

## 2021-02-23 MED ORDER — DEXAMETHASONE SODIUM PHOSPHATE 10 MG/ML IJ SOLN: 8.0000 mg | Freq: Once | INTRAMUSCULAR | Status: DC

## 2021-02-23 MED ORDER — MORPHINE SULFATE (PF) 2 MG/ML IV SOLN
0.5000 mg | INTRAVENOUS | Status: DC | PRN
  Administered 2021-02-23: 1 mg via INTRAVENOUS
  Filled 2021-02-23: qty 1

## 2021-02-23 MED ORDER — PROPOFOL 10 MG/ML IV BOLUS
INTRAVENOUS | Status: DC | PRN
  Administered 2021-02-23: 100 mg via INTRAVENOUS

## 2021-02-23 MED ORDER — LEVOTHYROXINE SODIUM 75 MCG PO TABS
75.0000 ug | ORAL_TABLET | Freq: Every day | ORAL | Status: DC
  Administered 2021-02-24 – 2021-02-25 (×2): 75 ug via ORAL
  Filled 2021-02-23 (×2): qty 1

## 2021-02-23 MED ORDER — SODIUM CHLORIDE 0.9 % IR SOLN
Status: DC | PRN
  Administered 2021-02-23: 1000 mL

## 2021-02-23 MED ORDER — ONDANSETRON HCL 4 MG/2ML IJ SOLN: 4.0000 mg | Freq: Four times a day (QID) | INTRAMUSCULAR | Status: DC | PRN

## 2021-02-23 MED ORDER — DEXAMETHASONE SODIUM PHOSPHATE 10 MG/ML IJ SOLN
INTRAMUSCULAR | Status: DC | PRN
  Administered 2021-02-23: 10 mg via INTRAVENOUS

## 2021-02-23 MED ORDER — DEXAMETHASONE SODIUM PHOSPHATE 10 MG/ML IJ SOLN
10.0000 mg | Freq: Once | INTRAMUSCULAR | Status: AC
  Administered 2021-02-24: 10 mg via INTRAVENOUS
  Filled 2021-02-23: qty 1

## 2021-02-23 MED ORDER — DULOXETINE HCL 30 MG PO CPEP
30.0000 mg | ORAL_CAPSULE | Freq: Every day | ORAL | Status: DC
  Administered 2021-02-24 – 2021-02-25 (×2): 30 mg via ORAL
  Filled 2021-02-23 (×2): qty 1

## 2021-02-23 MED ORDER — METHOCARBAMOL 500 MG IVPB - SIMPLE MED
INTRAVENOUS | Status: AC
  Administered 2021-02-23: 500 mg via INTRAVENOUS
  Filled 2021-02-23: qty 50

## 2021-02-23 MED ORDER — METHOCARBAMOL 500 MG IVPB - SIMPLE MED
500.0000 mg | Freq: Four times a day (QID) | INTRAVENOUS | Status: DC | PRN
  Filled 2021-02-23: qty 50

## 2021-02-23 MED ORDER — DIPHENHYDRAMINE HCL 12.5 MG/5ML PO ELIX: 12.5000 mg | ORAL_SOLUTION | ORAL | Status: DC | PRN

## 2021-02-23 MED ORDER — PANTOPRAZOLE SODIUM 40 MG PO TBEC
40.0000 mg | DELAYED_RELEASE_TABLET | Freq: Every day | ORAL | Status: DC
  Administered 2021-02-24 – 2021-02-25 (×2): 40 mg via ORAL
  Filled 2021-02-23 (×2): qty 1

## 2021-02-23 MED ORDER — FENTANYL CITRATE (PF) 100 MCG/2ML IJ SOLN
INTRAMUSCULAR | Status: AC
  Filled 2021-02-23: qty 2

## 2021-02-23 MED ORDER — TRANEXAMIC ACID-NACL 1000-0.7 MG/100ML-% IV SOLN
1000.0000 mg | Freq: Once | INTRAVENOUS | Status: DC
  Filled 2021-02-23: qty 100

## 2021-02-23 MED ORDER — LISINOPRIL 5 MG PO TABS
5.0000 mg | ORAL_TABLET | Freq: Every day | ORAL | Status: DC
  Administered 2021-02-24 – 2021-02-25 (×2): 5 mg via ORAL
  Filled 2021-02-23 (×2): qty 1

## 2021-02-23 MED ORDER — LACTATED RINGERS IV SOLN: INTRAVENOUS | Status: DC

## 2021-02-23 MED ORDER — LIDOCAINE 2% (20 MG/ML) 5 ML SYRINGE
INTRAMUSCULAR | Status: AC
  Filled 2021-02-23: qty 5

## 2021-02-23 MED ORDER — LIDOCAINE 2% (20 MG/ML) 5 ML SYRINGE
INTRAMUSCULAR | Status: DC | PRN
  Administered 2021-02-23: 100 mg via INTRAVENOUS

## 2021-02-23 MED ORDER — OXYCODONE HCL 5 MG PO TABS
ORAL_TABLET | ORAL | Status: AC
  Filled 2021-02-23: qty 1

## 2021-02-23 MED ORDER — HYDROMORPHONE HCL 2 MG/ML IJ SOLN
INTRAMUSCULAR | Status: AC
  Filled 2021-02-23: qty 1

## 2021-02-23 MED ORDER — EPHEDRINE SULFATE 50 MG/ML IJ SOLN
INTRAMUSCULAR | Status: DC | PRN
  Administered 2021-02-23: 10 mg via INTRAVENOUS

## 2021-02-23 MED ORDER — ROCURONIUM BROMIDE 10 MG/ML (PF) SYRINGE
PREFILLED_SYRINGE | INTRAVENOUS | Status: DC | PRN
  Administered 2021-02-23: 60 mg via INTRAVENOUS

## 2021-02-23 MED ORDER — PROPOFOL 500 MG/50ML IV EMUL
INTRAVENOUS | Status: DC | PRN
  Administered 2021-02-23: 100 ug/kg/min via INTRAVENOUS

## 2021-02-23 MED ORDER — POVIDONE-IODINE 10 % EX SWAB
2.0000 "application " | Freq: Once | CUTANEOUS | Status: AC
  Administered 2021-02-23: 2 via TOPICAL

## 2021-02-23 MED ORDER — FENTANYL CITRATE (PF) 250 MCG/5ML IJ SOLN
INTRAMUSCULAR | Status: AC
  Filled 2021-02-23: qty 5

## 2021-02-23 MED ORDER — METHOCARBAMOL 500 MG PO TABS
500.0000 mg | ORAL_TABLET | Freq: Four times a day (QID) | ORAL | Status: DC | PRN
  Administered 2021-02-24 (×2): 500 mg via ORAL
  Filled 2021-02-23 (×2): qty 1

## 2021-02-23 MED ORDER — ORAL CARE MOUTH RINSE
15.0000 mL | Freq: Once | OROMUCOSAL | Status: AC
  Administered 2021-02-23: 15 mL via OROMUCOSAL

## 2021-02-23 MED ORDER — ONDANSETRON HCL 4 MG/2ML IJ SOLN
INTRAMUSCULAR | Status: AC
  Filled 2021-02-23: qty 2

## 2021-02-23 MED ORDER — METOCLOPRAMIDE HCL 5 MG/ML IJ SOLN: 5.0000 mg | Freq: Three times a day (TID) | INTRAMUSCULAR | Status: DC | PRN

## 2021-02-23 MED ORDER — OXYCODONE HCL 5 MG PO TABS
5.0000 mg | ORAL_TABLET | Freq: Once | ORAL | Status: AC | PRN
  Administered 2021-02-23: 5 mg via ORAL

## 2021-02-23 MED ORDER — ONDANSETRON HCL 4 MG PO TABS: 4.0000 mg | ORAL_TABLET | Freq: Four times a day (QID) | ORAL | Status: DC | PRN

## 2021-02-23 MED ORDER — SODIUM CHLORIDE 0.9 % IV SOLN: INTRAVENOUS | Status: DC

## 2021-02-23 MED ORDER — POLYETHYLENE GLYCOL 3350 17 G PO PACK: 17.0000 g | PACK | Freq: Every day | ORAL | Status: DC | PRN

## 2021-02-23 MED ORDER — PROPOFOL 10 MG/ML IV BOLUS
INTRAVENOUS | Status: AC
  Filled 2021-02-23: qty 20

## 2021-02-23 MED ORDER — CEFAZOLIN SODIUM-DEXTROSE 2-4 GM/100ML-% IV SOLN
2.0000 g | INTRAVENOUS | Status: AC
  Administered 2021-02-23: 2 g via INTRAVENOUS
  Filled 2021-02-23: qty 100

## 2021-02-23 MED ORDER — PHENOL 1.4 % MT LIQD: 1.0000 | OROMUCOSAL | Status: DC | PRN

## 2021-02-23 MED ORDER — MENTHOL 3 MG MT LOZG: 1.0000 | LOZENGE | OROMUCOSAL | Status: DC | PRN

## 2021-02-23 MED ORDER — PREDNISONE 5 MG PO TABS
5.0000 mg | ORAL_TABLET | Freq: Every day | ORAL | Status: DC
  Administered 2021-02-25: 5 mg via ORAL
  Filled 2021-02-23: qty 1

## 2021-02-23 MED ORDER — ASPIRIN 81 MG PO CHEW
81.0000 mg | CHEWABLE_TABLET | Freq: Two times a day (BID) | ORAL | Status: DC
  Administered 2021-02-23 – 2021-02-25 (×4): 81 mg via ORAL
  Filled 2021-02-23 (×4): qty 1

## 2021-02-23 MED ORDER — BISACODYL 10 MG RE SUPP: 10.0000 mg | Freq: Every day | RECTAL | Status: DC | PRN

## 2021-02-23 MED ORDER — FENTANYL CITRATE (PF) 100 MCG/2ML IJ SOLN
INTRAMUSCULAR | Status: AC
  Administered 2021-02-23: 25 ug via INTRAVENOUS
  Filled 2021-02-23: qty 2

## 2021-02-23 MED ORDER — METOCLOPRAMIDE HCL 5 MG PO TABS: 5.0000 mg | ORAL_TABLET | Freq: Three times a day (TID) | ORAL | Status: DC | PRN

## 2021-02-23 MED ORDER — MIDAZOLAM HCL 2 MG/2ML IJ SOLN
INTRAMUSCULAR | Status: AC
  Filled 2021-02-23: qty 2

## 2021-02-23 MED ORDER — ACETAMINOPHEN 160 MG/5ML PO SOLN: 325.0000 mg | ORAL | Status: DC | PRN

## 2021-02-23 MED ORDER — CEFAZOLIN SODIUM-DEXTROSE 2-4 GM/100ML-% IV SOLN
2.0000 g | Freq: Four times a day (QID) | INTRAVENOUS | Status: AC
  Administered 2021-02-23 (×2): 2 g via INTRAVENOUS
  Filled 2021-02-23 (×2): qty 100

## 2021-02-23 MED ORDER — GABAPENTIN 300 MG PO CAPS
600.0000 mg | ORAL_CAPSULE | Freq: Three times a day (TID) | ORAL | Status: DC
  Administered 2021-02-23 – 2021-02-25 (×6): 600 mg via ORAL
  Filled 2021-02-23 (×6): qty 2

## 2021-02-23 MED ORDER — HYDROCODONE-ACETAMINOPHEN 5-325 MG PO TABS
1.0000 | ORAL_TABLET | ORAL | Status: DC | PRN
  Administered 2021-02-23: 2 via ORAL
  Administered 2021-02-23 – 2021-02-24 (×3): 1 via ORAL
  Administered 2021-02-24: 2 via ORAL
  Administered 2021-02-25: 1 via ORAL
  Filled 2021-02-23 (×4): qty 1
  Filled 2021-02-23 (×3): qty 2

## 2021-02-23 MED ORDER — PHENYLEPHRINE HCL-NACL 10-0.9 MG/250ML-% IV SOLN
INTRAVENOUS | Status: DC | PRN
  Administered 2021-02-23: 50 ug/min via INTRAVENOUS

## 2021-02-23 MED ORDER — MEPERIDINE HCL 50 MG/ML IJ SOLN: 6.2500 mg | INTRAMUSCULAR | Status: DC | PRN

## 2021-02-23 MED ORDER — DEXAMETHASONE SODIUM PHOSPHATE 10 MG/ML IJ SOLN
INTRAMUSCULAR | Status: AC
  Filled 2021-02-23: qty 1

## 2021-02-23 MED ORDER — ONDANSETRON HCL 4 MG/2ML IJ SOLN: 4.0000 mg | Freq: Once | INTRAMUSCULAR | Status: DC | PRN

## 2021-02-23 MED ORDER — FENTANYL CITRATE (PF) 100 MCG/2ML IJ SOLN
25.0000 ug | INTRAMUSCULAR | Status: DC | PRN
  Administered 2021-02-23 (×2): 25 ug via INTRAVENOUS

## 2021-02-23 MED ORDER — VANCOMYCIN HCL IN DEXTROSE 1-5 GM/200ML-% IV SOLN
1000.0000 mg | Freq: Once | INTRAVENOUS | Status: AC
  Administered 2021-02-23: 1000 mg via INTRAVENOUS
  Filled 2021-02-23: qty 200

## 2021-02-23 MED ORDER — OXYCODONE HCL 5 MG/5ML PO SOLN: 5.0000 mg | Freq: Once | ORAL | Status: AC | PRN

## 2021-02-23 MED ORDER — CHLORHEXIDINE GLUCONATE 0.12 % MT SOLN: 15.0000 mL | Freq: Once | OROMUCOSAL | Status: AC

## 2021-02-23 MED ORDER — TRANEXAMIC ACID-NACL 1000-0.7 MG/100ML-% IV SOLN
1000.0000 mg | INTRAVENOUS | Status: AC
  Administered 2021-02-23: 1000 mg via INTRAVENOUS
  Filled 2021-02-23: qty 100

## 2021-02-23 MED ORDER — FERROUS SULFATE 325 (65 FE) MG PO TABS
325.0000 mg | ORAL_TABLET | Freq: Three times a day (TID) | ORAL | Status: DC
  Administered 2021-02-23 – 2021-02-25 (×6): 325 mg via ORAL
  Filled 2021-02-23 (×6): qty 1

## 2021-02-23 MED ORDER — ACETAMINOPHEN 325 MG PO TABS: 325.0000 mg | ORAL_TABLET | ORAL | Status: DC | PRN

## 2021-02-23 MED ORDER — ALBUMIN HUMAN 5 % IV SOLN: INTRAVENOUS | Status: DC | PRN

## 2021-02-23 MED ORDER — EPHEDRINE 5 MG/ML INJ
INTRAVENOUS | Status: AC
  Filled 2021-02-23: qty 10

## 2021-02-23 MED ORDER — FENTANYL CITRATE (PF) 100 MCG/2ML IJ SOLN
INTRAMUSCULAR | Status: DC | PRN
  Administered 2021-02-23 (×2): 100 ug via INTRAVENOUS
  Administered 2021-02-23: 50 ug via INTRAVENOUS

## 2021-02-23 MED ORDER — SIMVASTATIN 40 MG PO TABS
40.0000 mg | ORAL_TABLET | Freq: Every day | ORAL | Status: DC
  Administered 2021-02-24 – 2021-02-25 (×2): 40 mg via ORAL
  Filled 2021-02-23 (×2): qty 1

## 2021-02-23 SURGICAL SUPPLY — 42 items
BAG DECANTER FOR FLEXI CONT (MISCELLANEOUS) IMPLANT
BAG ZIPLOCK 12X15 (MISCELLANEOUS) IMPLANT
BLADE SAG 18X100X1.27 (BLADE) ×2 IMPLANT
COVER PERINEAL POST (MISCELLANEOUS) ×2 IMPLANT
COVER SURGICAL LIGHT HANDLE (MISCELLANEOUS) ×2 IMPLANT
COVER WAND RF STERILE (DRAPES) IMPLANT
CUP ACET PINNACLE SECTR 50MM (Hips) ×1 IMPLANT
DERMABOND ADVANCED (GAUZE/BANDAGES/DRESSINGS) ×1
DERMABOND ADVANCED .7 DNX12 (GAUZE/BANDAGES/DRESSINGS) ×1 IMPLANT
DRAPE STERI IOBAN 125X83 (DRAPES) ×2 IMPLANT
DRAPE U-SHAPE 47X51 STRL (DRAPES) ×4 IMPLANT
DRESSING AQUACEL AG SP 3.5X10 (GAUZE/BANDAGES/DRESSINGS) ×1 IMPLANT
DRSG AQUACEL AG ADV 3.5X10 (GAUZE/BANDAGES/DRESSINGS) ×2 IMPLANT
DRSG AQUACEL AG SP 3.5X10 (GAUZE/BANDAGES/DRESSINGS) ×2
DURAPREP 26ML APPLICATOR (WOUND CARE) ×2 IMPLANT
ELECT REM PT RETURN 15FT ADLT (MISCELLANEOUS) ×2 IMPLANT
ELIMINATOR HOLE APEX DEPUY (Hips) ×2 IMPLANT
GLOVE ORTHO TXT STRL SZ7.5 (GLOVE) ×4 IMPLANT
GLOVE SURG ENC MOIS LTX SZ6 (GLOVE) ×4 IMPLANT
GLOVE SURG UNDER POLY LF SZ6.5 (GLOVE) ×2 IMPLANT
GLOVE SURG UNDER POLY LF SZ7.5 (GLOVE) ×2 IMPLANT
GOWN STRL REUS W/TWL LRG LVL3 (GOWN DISPOSABLE) ×4 IMPLANT
HEAD FEM STD 32X+1 STRL (Hips) ×2 IMPLANT
HOLDER FOLEY CATH W/STRAP (MISCELLANEOUS) IMPLANT
KIT TURNOVER KIT A (KITS) ×2 IMPLANT
LINER ACET PNNCL PLUS4 NEUTRAL (Hips) ×1 IMPLANT
PACK ANTERIOR HIP CUSTOM (KITS) ×2 IMPLANT
PENCIL SMOKE EVACUATOR (MISCELLANEOUS) ×2 IMPLANT
PINNACLE PLUS 4 NEUTRAL (Hips) ×2 IMPLANT
PINNACLE SECTOR CUP 50MM (Hips) ×2 IMPLANT
SCREW 6.5MMX30MM (Screw) ×2 IMPLANT
STEM FEMORAL SZ5 HIGH ACTIS (Stem) ×2 IMPLANT
SUT MNCRL AB 4-0 PS2 18 (SUTURE) ×2 IMPLANT
SUT STRATAFIX 0 PDS 27 VIOLET (SUTURE) ×2
SUT VIC AB 1 CT1 36 (SUTURE) ×6 IMPLANT
SUT VIC AB 2-0 CT1 27 (SUTURE) ×4
SUT VIC AB 2-0 CT1 TAPERPNT 27 (SUTURE) ×2 IMPLANT
SUTURE STRATFX 0 PDS 27 VIOLET (SUTURE) ×1 IMPLANT
TRAY FOLEY MTR SLVR 14FR STAT (SET/KITS/TRAYS/PACK) IMPLANT
TRAY FOLEY MTR SLVR 16FR STAT (SET/KITS/TRAYS/PACK) IMPLANT
TUBE SUCTION HIGH CAP CLEAR NV (SUCTIONS) ×2 IMPLANT
WATER STERILE IRR 1000ML POUR (IV SOLUTION) ×2 IMPLANT

## 2021-02-23 NOTE — Transfer of Care (Addendum)
Immediate Anesthesia Transfer of Care Note  Patient: Teresa Kidd  Procedure(s) Performed: TOTAL HIP ARTHROPLASTY ANTERIOR APPROACH (Right Hip)  Patient Location: PACU  Anesthesia Type:General  Level of Consciousness: awake, alert , oriented and patient cooperative  Airway & Oxygen Therapy: Patient Spontanous Breathing and Patient connected to face mask oxygen  Post-op Assessment: Report given to RN, Post -op Vital signs reviewed and stable and Patient moving all extremities X 4  Post vital signs: stable  Last Vitals:  Vitals Value Taken Time  BP 159/85 02/23/21 0848  Temp    Pulse 68 02/23/21 0852  Resp 16 02/23/21 0852  SpO2 100 % 02/23/21 0852  Vitals shown include unvalidated device data.  Last Pain:  Vitals:   02/23/21 0530  TempSrc: Oral         Complications: No complications documented.

## 2021-02-23 NOTE — Interval H&P Note (Signed)
History and Physical Interval Note:  02/23/2021 6:56 AM  Teresa Kidd  has presented today for surgery, with the diagnosis of Right hip osteoarthritis.  The various methods of treatment have been discussed with the patient and family. After consideration of risks, benefits and other options for treatment, the patient has consented to  Procedure(s): TOTAL HIP ARTHROPLASTY ANTERIOR APPROACH (Right) as a surgical intervention.  The patient's history has been reviewed, patient examined, no change in status, stable for surgery.  I have reviewed the patient's chart and labs.  Questions were answered to the patient's satisfaction.     Mauri Pole

## 2021-02-23 NOTE — Anesthesia Procedure Notes (Signed)
Procedure Name: Intubation Date/Time: 02/23/2021 7:23 AM Performed by: Lissa Morales, CRNA Pre-anesthesia Checklist: Patient identified, Emergency Drugs available, Suction available and Patient being monitored Patient Re-evaluated:Patient Re-evaluated prior to induction Oxygen Delivery Method: Circle system utilized Preoxygenation: Pre-oxygenation with 100% oxygen Induction Type: IV induction Ventilation: Mask ventilation without difficulty Laryngoscope Size: Mac and 4 Grade View: Grade II Tube type: Oral Number of attempts: 1 Airway Equipment and Method: Stylet and Oral airway Placement Confirmation: ETT inserted through vocal cords under direct vision,  positive ETCO2 and breath sounds checked- equal and bilateral Secured at: 21 cm Tube secured with: Tape Dental Injury: Teeth and Oropharynx as per pre-operative assessment

## 2021-02-23 NOTE — Evaluation (Signed)
Physical Therapy Evaluation Patient Details Name: Teresa Kidd MRN: [id number] DOB: [birth year] Today's Date: 02/23/2021   History of Present Illness  Patient is 80 y.o. female s/p Rt THA anterior approach on 02/23/21 with PMH significant for HTN, hypothyroidism, GERD, OA, DVT, breast cancer.    Clinical Impression  Teresa Kidd is a 80 y.o. female POD 0 s/p Rt THA. Patient reports modified independenc with RW for mobility at assist for homemaking at baseline. Patient is now limited by functional impairments (see PT problem list below) and requires min assist/guard for transfers and gait with RW. Patient was able to ambulate ~35 feet with RW and min guard/assist. Patient instructed in exercise to facilitate circulation to manage edema and reduce risk of DVT. Patient will benefit from continued skilled PT interventions to address impairments and progress towards PLOF. Acute PT will follow to progress mobility and stair training in preparation for safe discharge home.     Follow Up Recommendations Follow surgeon's recommendation for DC plan and follow-up therapies;Home health PT    Equipment Recommendations  3in1 (PT)    Recommendations for Other Services       Precautions / Restrictions Precautions Precautions: Fall Restrictions Weight Bearing Restrictions: No Other Position/Activity Restrictions: WBAT      Mobility  Bed Mobility Overal bed mobility: Needs Assistance Bed Mobility: Supine to Sit     Supine to sit: Min assist;HOB elevated     General bed mobility comments: pt using bed rail and assist needed to bring Rt LE off EOB and fully raise trunk.    Transfers Overall transfer level: Needs assistance Equipment used: Rolling walker (2 wheeled) Transfers: Sit to/from Stand Sit to Stand: Min assist         General transfer comment: cues for hand placement, pt using single UE on EOB for power up. light assist to steady with rise.  Ambulation/Gait Ambulation/Gait  assistance: Min assist;Min guard Gait Distance (Feet): 35 Feet Assistive device: Rolling walker (2 wheeled) Gait Pattern/deviations: Step-to pattern;Decreased stride length;Decreased weight shift to right Gait velocity: decr   General Gait Details: min cues for step pattern and safe position to RW, pt maintained throughout. no overt LOB noted, pt with decreased shift to Rt.  Stairs            Wheelchair Mobility    Modified Rankin (Stroke Patients Only)       Balance Overall balance assessment: Needs assistance Sitting-balance support: Feet supported Sitting balance-Leahy Scale: Good     Standing balance support: During functional activity;Bilateral upper extremity supported Standing balance-Leahy Scale: Poor                               Pertinent Vitals/Pain Pain Assessment: Faces Faces Pain Scale: Hurts a little bit Pain Location: Rt hip Pain Descriptors / Indicators: Discomfort Pain Intervention(s): Limited activity within patient's tolerance;Monitored during session;Premedicated before session;Repositioned;Ice applied    Home Living Family/patient expects to be discharged to:: Private residence Living Arrangements: Children;Other (Comment) (PCA) Available Help at Discharge: Personal care attendant;Family Type of Home: Mobile home Home Access: Stairs to enter Entrance Stairs-Rails: Can reach both Entrance Stairs-Number of Steps: 3 Home Layout: One level Home Equipment: Walker - 2 wheels;Bedside commode;Cane - single point;Hand held shower head;Hospital bed      Prior Function Level of Independence: Independent with assistive device(s);Needs assistance   Gait / Transfers Assistance Needed: pt using RW for mobility and SPC for stairs.  ADL's / Homemaking  Assistance Needed: pt is typically independent with bathing and dressing. personal aid does homemaking and helps pt with shoes/socks.        Hand Dominance   Dominant Hand: Right     Extremity/Trunk Assessment   Upper Extremity Assessment Upper Extremity Assessment: Overall WFL for tasks assessed    Lower Extremity Assessment Lower Extremity Assessment: Generalized weakness    Cervical / Trunk Assessment Cervical / Trunk Assessment: Normal  Communication   Communication: No difficulties  Cognition Arousal/Alertness: Awake/alert Behavior During Therapy: WFL for tasks assessed/performed Overall Cognitive Status: Within Functional Limits for tasks assessed                                 General Comments: pt lethargic at start but increased alertness with activity      General Comments      Exercises Total Joint Exercises Ankle Circles/Pumps: AROM;Both;20 reps;Seated   Assessment/Plan    PT Assessment Patient needs continued PT services  PT Problem List Decreased strength;Decreased range of motion;Decreased activity tolerance;Decreased balance;Decreased mobility;Decreased knowledge of use of DME;Decreased knowledge of precautions;Pain       PT Treatment Interventions DME instruction;Gait training;Stair training;Functional mobility training;Therapeutic activities;Therapeutic exercise;Balance training;Patient/family education    PT Goals (Current goals can be found in the Care Plan section)  Acute Rehab PT Goals Patient Stated Goal: get back home and back to moving without pain PT Goal Formulation: With patient Time For Goal Achievement: 03/02/21 Potential to Achieve Goals: Good    Frequency 7X/week   Barriers to discharge        Co-evaluation               AM-PAC PT "6 Clicks" Mobility  Outcome Measure Help needed turning from your back to your side while in a flat bed without using bedrails?: A Little Help needed moving from lying on your back to sitting on the side of a flat bed without using bedrails?: A Little Help needed moving to and from a bed to a chair (including a wheelchair)?: A Little Help needed standing up  from a chair using your arms (e.g., wheelchair or bedside chair)?: A Little Help needed to walk in hospital room?: A Little Help needed climbing 3-5 steps with a railing? : A Lot 6 Click Score: 17    End of Session Equipment Utilized During Treatment: Gait belt Activity Tolerance: Patient tolerated treatment well Patient left: in chair;with call bell/phone within reach;with chair alarm set Nurse Communication: Mobility status PT Visit Diagnosis: Muscle weakness (generalized) (M62.81);Difficulty in walking, not elsewhere classified (R26.2)    Time: 3086-5784 PT Time Calculation (min) (ACUTE ONLY): 29 min   Charges:   PT Evaluation $PT Eval Low Complexity: 1 Low PT Treatments $Gait Training: 8-22 mins        Verner Mould, DPT Acute Rehabilitation Services Office [phone] Pager [phone]    Jacques Navy 02/23/2021, 3:05 PM

## 2021-02-23 NOTE — Op Note (Signed)
NAME:  Teresa Kidd                ACCOUNT NO.: [id number]      MEDICAL RECORD NO.: [id number]      FACILITY:  [facility]      PHYSICIAN:  Mauri Pole  DATE OF BIRTH:  [birth year]     DATE OF PROCEDURE:  02/23/2021                                 OPERATIVE REPORT         PREOPERATIVE DIAGNOSIS: Right  hip osteoarthritis.      POSTOPERATIVE DIAGNOSIS:  Right hip osteoarthritis.      PROCEDURE:  Right total hip replacement through an anterior approach   utilizing DePuy THR system, component size 50 mm pinnacle cup, a size 32+4 neutral   Altrex liner, a size 5 Hi Actis stem with a 32+1 Articuleze metal head ball.      SURGEON:  Pietro Cassis. Alvan Dame, M.D.      ASSISTANT:  Griffith Citron, PA-C     ANESTHESIA:  General.      SPECIMENS:  None.      COMPLICATIONS:  None.      BLOOD LOSS:  550 cc     DRAINS:  None.      INDICATION OF THE PROCEDURE:  Teresa Kidd is a 80 y.o. female who had   presented to office for evaluation of right hip pain.  Radiographs revealed   progressive degenerative changes with bone-on-bone   articulation of the  hip joint, including subchondral cystic changes and osteophytes.  The patient had painful limited range of   motion significantly affecting their overall quality of life and function.  The patient was failing to    respond to conservative measures including medications and/or injections and activity modification and at this point was ready   to proceed with more definitive measures.  Consent was obtained for   benefit of pain relief.  Specific risks of infection, DVT, component   failure, dislocation, neurovascular injury, and need for revision surgery were reviewed in the office as well discussion of   the anterior versus posterior approach were reviewed.     PROCEDURE IN DETAIL:  The patient was brought to operative theater.   Once adequate anesthesia, preoperative antibiotics, 2 gm of Ancef, 1gm of Vancomycin, 1 gm of  Tranexamic Acid, and 10 mg of Decadron were administered, the patient was positioned supine on the [employer] table.  Once the patient was safely positioned with adequate padding of boney prominences we predraped out the hip, and used fluoroscopy to confirm orientation of the pelvis.      The right hip was then prepped and draped from proximal iliac crest to   mid thigh with a shower curtain technique.      Time-out was performed identifying the patient, planned procedure, and the appropriate extremity.     An incision was then made 2 cm lateral to the   anterior superior iliac spine extending over the orientation of the   tensor fascia lata muscle and sharp dissection was carried down to the   fascia of the muscle.      The fascia was then incised.  The muscle belly was identified and swept   laterally and retractor placed along the superior neck.  Following   cauterization of the circumflex vessels and removing some  pericapsular   fat, a second cobra retractor was placed on the inferior neck.  A T-capsulotomy was made along the line of the   superior neck to the trochanteric fossa, then extended proximally and   distally.  Tag sutures were placed and the retractors were then placed   intracapsular.  We then identified the trochanteric fossa and   orientation of my neck cut and then made a neck osteotomy with the femur on traction.  The femoral   head was removed without difficulty or complication.  Traction was let   off and retractors were placed posterior and anterior around the   acetabulum.      The labrum and foveal tissue were debrided.  I began reaming with a 43 mm   reamer and reamed up to 49 mm reamer with good bony bed preparation and a 50 mm  cup was chosen.  The final 50 mm Pinnacle cup was then impacted under fluoroscopy to confirm the depth of penetration and orientation with respect to   Abduction and forward flexion.  A screw was placed into the ilium followed by the hole  eliminator.  The final   32+4 neutral Altrex liner was impacted with good visualized rim fit.  The cup was positioned anatomically within the acetabular portion of the pelvis.      At this point, the femur was rolled to 100 degrees.  Further capsule was   released off the inferior aspect of the femoral neck.  I then   released the superior capsule proximally.  With the leg in a neutral position the hook was placed laterally   along the femur under the vastus lateralis origin and elevated manually and then held in position using the hook attachment on the bed.  The leg was then extended and adducted with the leg rolled to 100   degrees of external rotation.  Retractors were placed along the medial calcar and posteriorly over the greater trochanter.  Once the proximal femur was fully   exposed, I used a box osteotome to set orientation.  I then began   broaching with the starting chili pepper broach and passed this by hand and then broached up to 5.  With the 5 broach in place I chose a high offset neck and did several trial reductions.  The offset was appropriate, leg lengths   appeared to be equal best matched with the 32+1 head ball trial confirmed radiographically.   Given these findings, I went ahead and dislocated the hip, repositioned all   retractors and positioned the right hip in the extended and abducted position.  The final 5 Hi Actis stem was   chosen and it was impacted down to the level of neck cut.  Based on this   and the trial reductions, a final 32+1 Articuleze metal head ball was chosen and   impacted onto a clean and dry trunnion, and the hip was reduced.  The   hip had been irrigated throughout the case again at this point.  I did   reapproximate the superior capsular leaflet to the anterior leaflet   using #1 Vicryl.  The fascia of the   tensor fascia lata muscle was then reapproximated using #1 Vicryl and #0 Stratafix sutures.  The   remaining wound was closed with 2-0  Vicryl and running 4-0 Monocryl.   The hip was cleaned, dried, and dressed sterilely using Dermabond and   Aquacel dressing.  The patient was then brought  to recovery room in stable condition tolerating the procedure well.    Griffith Citron, PA-C was present for the entirety of the case involved from   preoperative positioning, perioperative retractor management, general   facilitation of the case, as well as primary wound closure as assistant.            Pietro Cassis Alvan Dame, M.D.        02/23/2021 8:31 AM

## 2021-02-23 NOTE — Plan of Care (Signed)
Discussed with patient about plan of care for post-op day 0. ° ° °Will continue to monitor patient.  ° ° °SWhittemore, RN ° °

## 2021-02-23 NOTE — Anesthesia Postprocedure Evaluation (Signed)
Anesthesia Post Note  Patient: Teresa Kidd  Procedure(s) Performed: TOTAL HIP ARTHROPLASTY ANTERIOR APPROACH (Right Hip)     Patient location during evaluation: PACU Anesthesia Type: General Level of consciousness: awake and alert Pain management: pain level controlled Vital Signs Assessment: post-procedure vital signs reviewed and stable Respiratory status: spontaneous breathing, nonlabored ventilation, respiratory function stable and patient connected to nasal cannula oxygen Cardiovascular status: blood pressure returned to baseline and stable Postop Assessment: no apparent nausea or vomiting Anesthetic complications: no   No complications documented.  Last Vitals:  Vitals:   02/23/21 0924 02/23/21 0930  BP: (!) 164/75 (!) 166/69  Pulse:  62  Resp:  13  Temp:    SpO2:  99%    Last Pain:  Vitals:   02/23/21 0930  TempSrc:   PainSc: 4                  Ayza Ripoll

## 2021-02-23 NOTE — Progress Notes (Signed)
Chaplain engaged in an initial visit with Danikah.  Teasia shared about recently having a procedure and the grogginess she has felt since then.  She was also looking forward to putting some food on her stomach since she hasn't had anything solid to eat due to the procedure.  She shared that she often eats even when she isn't hungry to make sure she is keeping herself up.  Mikaila also talked about where she is from, her caregiver, and her children.  She is from [place] and has a caregiver that her son hired who will make sure she gets back home.  Labresha detailed that her caregiver is not a Buyer, retail but does a good job of taking care of things at home.  She has been teaching the caregiver, Mateo Flow, to cook some of her mom's old recipes, which are very dear to her.  Jilliam spent time talking about cooking which chaplain could assess has been a memorable part of her life in how she has cared for her children and family.  Cooking also connects her to her mother.    Chaplain offered listening and presence.  Pranavi was grateful for chaplain coming by.      02/23/21 1500  Clinical Encounter Type  Visited With Patient  Visit Type Initial

## 2021-02-23 NOTE — Discharge Instructions (Signed)

## 2021-02-24 ENCOUNTER — Encounter (HOSPITAL_COMMUNITY): Payer: Self-pay | Admitting: Orthopedic Surgery

## 2021-02-24 DIAGNOSIS — Z7952 Long term (current) use of systemic steroids: Secondary | ICD-10-CM | POA: Diagnosis not present

## 2021-02-24 DIAGNOSIS — Z90710 Acquired absence of both cervix and uterus: Secondary | ICD-10-CM | POA: Diagnosis not present

## 2021-02-24 DIAGNOSIS — K219 Gastro-esophageal reflux disease without esophagitis: Secondary | ICD-10-CM | POA: Diagnosis present

## 2021-02-24 DIAGNOSIS — E890 Postprocedural hypothyroidism: Secondary | ICD-10-CM | POA: Diagnosis present

## 2021-02-24 DIAGNOSIS — Z9181 History of falling: Secondary | ICD-10-CM | POA: Diagnosis not present

## 2021-02-24 DIAGNOSIS — Z853 Personal history of malignant neoplasm of breast: Secondary | ICD-10-CM | POA: Diagnosis not present

## 2021-02-24 DIAGNOSIS — Z79890 Hormone replacement therapy (postmenopausal): Secondary | ICD-10-CM | POA: Diagnosis not present

## 2021-02-24 DIAGNOSIS — I10 Essential (primary) hypertension: Secondary | ICD-10-CM | POA: Diagnosis present

## 2021-02-24 DIAGNOSIS — Z86718 Personal history of other venous thrombosis and embolism: Secondary | ICD-10-CM | POA: Diagnosis not present

## 2021-02-24 DIAGNOSIS — Z79899 Other long term (current) drug therapy: Secondary | ICD-10-CM | POA: Diagnosis not present

## 2021-02-24 DIAGNOSIS — Z87442 Personal history of urinary calculi: Secondary | ICD-10-CM | POA: Diagnosis not present

## 2021-02-24 DIAGNOSIS — M1611 Unilateral primary osteoarthritis, right hip: Secondary | ICD-10-CM | POA: Diagnosis present

## 2021-02-24 DIAGNOSIS — Z888 Allergy status to other drugs, medicaments and biological substances status: Secondary | ICD-10-CM | POA: Diagnosis not present

## 2021-02-24 LAB — CBC
HCT: 29.5 % — ABNORMAL LOW (ref 36.0–46.0)
Hemoglobin: 9.3 g/dL — ABNORMAL LOW (ref 12.0–15.0)
MCH: 32.1 pg (ref 26.0–34.0)
MCHC: 31.5 g/dL (ref 30.0–36.0)
MCV: 101.7 fL — ABNORMAL HIGH (ref 80.0–100.0)
Platelets: 170 10*3/uL (ref 150–400)
RBC: 2.9 MIL/uL — ABNORMAL LOW (ref 3.87–5.11)
RDW: 14 % (ref 11.5–15.5)
WBC: 11 10*3/uL — ABNORMAL HIGH (ref 4.0–10.5)
nRBC: 0 % (ref 0.0–0.2)

## 2021-02-24 LAB — BASIC METABOLIC PANEL
Anion gap: 7 (ref 5–15)
BUN: 13 mg/dL (ref 8–23)
CO2: 25 mmol/L (ref 22–32)
Calcium: 9.4 mg/dL (ref 8.9–10.3)
Chloride: 108 mmol/L (ref 98–111)
Creatinine, Ser: 0.94 mg/dL (ref 0.44–1.00)
GFR, Estimated: >60 mL/min (ref >60)
Glucose, Bld: 138 mg/dL — ABNORMAL HIGH (ref 70–99)
Potassium: 4.2 mmol/L (ref 3.5–5.1)
Sodium: 140 mmol/L (ref 135–145)

## 2021-02-24 NOTE — TOC Initial Note (Signed)
Transition of Care [facility]) - Initial/Assessment Note    Patient Details  Name: Teresa Kidd MRN: [id number] Date of Birth: 14-Jan-1941  Transition of Care [facility]) CM/SW Contact:    Lennart Pall, LCSW Phone Number: 02/24/2021, 10:59 AM  Clinical Narrative:                 Met with pt to review dc needs and home supports.  Pt reports that she has a live-in caregiver who has been assisting her since surgery in June 2021.  Her nephew and son live close by and notes son recently had foot amputation.  Because of this, the caregiver is trying to assist both pt and her son and she hopes to regain as much independence as possible prior to return home.   Pt confirms she has all needed DME and plans for HEP.  No needs identified at this time.  TOC will continue to monitor.  Expected Discharge Plan: Home/Self Care Barriers to Discharge: Continued Medical Work up   Patient Goals and CMS Choice Patient states their goals for this hospitalization and ongoing recovery are:: return home      Expected Discharge Plan and Services Expected Discharge Plan: Home/Self Care In-house Referral: Clinical Social Work     Living arrangements for the past 2 months: Mobile Home                 DME Arranged: N/A DME Agency: NA                  Prior Living Arrangements/Services Living arrangements for the past 2 months: Mobile Home Lives with:: Other (Comment) (live-in caregiver) Patient language and need for interpreter reviewed:: Yes Do you feel safe going back to the place where you live?: Yes      Need for Family Participation in Patient Care: Yes (Comment) Care giver support system in place?: Yes (comment)   Criminal Activity/Legal Involvement Pertinent to Current Situation/Hospitalization: No - Comment as needed  Activities of Daily Living Home Assistive Devices/Equipment: Eyeglasses,Dentures (specify type),Cane (specify quad or straight),Walker (specify type) ADL Screening (condition at time of  admission) Patient's cognitive ability adequate to safely complete daily activities?: Yes Is the patient deaf or have difficulty hearing?: Yes Does the patient have difficulty seeing, even when wearing glasses/contacts?: No Does the patient have difficulty concentrating, remembering, or making decisions?: No Patient able to express need for assistance with ADLs?: Yes Does the patient have difficulty dressing or bathing?: No Independently performs ADLs?: Yes (appropriate for developmental age) Does the patient have difficulty walking or climbing stairs?: Yes Weakness of Legs: Both Weakness of Arms/Hands: None  Permission Sought/Granted Permission sought to share information with : Family Supports Permission granted to share information with : Yes, Verbal Permission Granted              Emotional Assessment Appearance:: Appears stated age Attitude/Demeanor/Rapport: Gracious Affect (typically observed): Accepting Orientation: : Oriented to Self,Oriented to Place,Oriented to  Time,Oriented to Situation Alcohol / Substance Use: Not Applicable Psych Involvement: No (comment)  Admission diagnosis:  S/P total hip arthroplasty [Z96.649] Patient Active Problem List   Diagnosis Date Noted  . 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 Cystocele, midline 08/22/2017   PCP:  Pcp, No Pharmacy:   [facility] [address] [place] [address] Phone: [phone] Fax: [phone]     Social Determinants of Health (SDOH) Interventions    Readmission Risk Interventions No flowsheet data  found.  

## 2021-02-24 NOTE — Progress Notes (Signed)
Physical Therapy Treatment Patient Details Name: Teresa Kidd MRN: [id number] DOB: 1941-07-08 Today's Date: 02/24/2021    History of Present Illness Patient is 80 y.o. female s/p Rt THA anterior approach on 02/23/21 with PMH significant for HTN, hypothyroidism, GERD, OA, DVT, breast cancer.    PT Comments    The patient ambulated x 200' with Rw, gait is smoother. Patient reports right hip discomfort. Has been up for several hours. Plan practice steps in AM, probable DC .   Follow Up Recommendations  Follow surgeon's recommendation for DC plan and follow-up therapies;Home health PT     Equipment Recommendations  None recommended by PT    Recommendations for Other Services       Precautions / Restrictions Precautions Precautions: Fall Restrictions Other Position/Activity Restrictions: WBAT    Mobility  Bed Mobility   Bed Mobility: Sit to Supine     Supine to sit: Min assist;HOB elevated Sit to supine: Min assist   General bed mobility comments: assisted with right leg onto bed    Transfers Overall transfer level: Needs assistance Equipment used: Rolling walker (2 wheeled) Transfers: Sit to/from Stand Sit to Stand: Supervision         General transfer comment: stands with use of UE's from recliner and BSC  Ambulation/Gait Ambulation/Gait assistance: Min guard Gait Distance (Feet): 200 Feet (an20') Assistive device: Rolling walker (2 wheeled) Gait Pattern/deviations: Step-through pattern Gait velocity: decr   General Gait Details: sequence smoothe and reciprocal   Chief Strategy Officer    Modified Rankin (Stroke Patients Only)       Balance Overall balance assessment: Needs assistance Sitting-balance support: Feet supported Sitting balance-Leahy Scale: Good     Standing balance support: During functional activity;No upper extremity supported Standing balance-Leahy Scale: Fair Standing balance comment: to pull up briefs                             Cognition Arousal/Alertness: Awake/alert                                            Exercises Total Joint Exercises Quad Sets: AROM;Both;10 reps Short Arc Quad: AROM;Right;10 reps;Supine Heel Slides: AAROM;Right;10 reps;Supine Hip ABduction/ADduction: AAROM;Right;10 reps;Supine    General Comments General comments (skin integrity, edema, etc.): stands and manipulates briefs and places pad  while standing      Pertinent Vitals/Pain Pain Assessment: 0-10 Pain Score: 7  Faces Pain Scale: Hurts little more Pain Location: Rt hip Pain Descriptors / Indicators: Discomfort Pain Intervention(s): Monitored during session;Repositioned    Home Living                      Prior Function            PT Goals (current goals can now be found in the care plan section) Progress towards PT goals: Progressing toward goals    Frequency    7X/week      PT Plan Current plan remains appropriate    Co-evaluation              AM-PAC PT "6 Clicks" Mobility   Outcome Measure  Help needed turning from your back to your side while in a flat bed without using bedrails?: A Little Help needed moving from lying  on your back to sitting on the side of a flat bed without using bedrails?: A Little Help needed moving to and from a bed to a chair (including a wheelchair)?: A Little Help needed standing up from a chair using your arms (e.g., wheelchair or bedside chair)?: A Little Help needed to walk in hospital room?: A Little Help needed climbing 3-5 steps with a railing? : A Lot 6 Click Score: 17    End of Session Equipment Utilized During Treatment: Gait belt Activity Tolerance: Patient tolerated treatment well Patient left: in bed;with call bell/phone within reach;with bed alarm set Nurse Communication: Mobility status PT Visit Diagnosis: Muscle weakness (generalized) (M62.81);Difficulty in walking, not elsewhere classified  (R26.2)     Time: 8719-5974 PT Time Calculation (min) (ACUTE ONLY): 27 min  Charges:  $Gait Training: 8-22 mins $Self Care/Home Management: Ray Pager [phone] Office [phone]    Claretha Cooper 02/24/2021, 2:20 PM

## 2021-02-24 NOTE — Progress Notes (Signed)
   Subjective: 1 Day Post-Op Procedure(s) (LRB): TOTAL HIP ARTHROPLASTY ANTERIOR APPROACH (Right) Patient reports pain as moderate.   Patient seen in rounds for Dr. Alvan Dame. Patient is resting in bed on exam this morning. She reports she was not able to easily lower herself onto the toilet yesterday, and slammed down into it which has caused some posterior hip pain. Otherwise, she is doing fairly well. Her son was planning to help care for her after surgery, but had to emergently have a foot amputation. She is concerned about not being independent enough to be safe at home at this point. Denies CP, SHOB, N/V.  We will continue therapy today.   Objective: Vital signs in last 24 hours: Temp:  [97.6 F (36.4 C)-98.6 F (37 C)] 98.6 F (37 C) (04/20 0500) Pulse Rate:  [58-73] 71 (04/20 0500) Resp:  [8-24] 15 (04/20 0500) BP: (118-177)/(63-87) 126/63 (04/20 0500) SpO2:  [88 %-100 %] 97 % (04/20 0500)  Intake/Output from previous day:  Intake/Output Summary (Last 24 hours) at 02/24/2021 0852 Last data filed at 02/24/2021 0500 Gross per 24 hour  Intake 1657.4 ml  Output 1500 ml  Net 157.4 ml     Intake/Output this shift: No intake/output data recorded.  Labs: Recent Labs    02/24/21 0255  HGB 9.3*   Recent Labs    02/24/21 0255  WBC 11.0*  RBC 2.90*  HCT 29.5*  PLT 170   Recent Labs    02/24/21 0255  NA 140  K 4.2  CL 108  CO2 25  BUN 13  CREATININE 0.94  GLUCOSE 138*  CALCIUM 9.4   No results for input(s): LABPT, INR in the last 72 hours.  Exam: General - Patient is Alert and Oriented Extremity - Neurologically intact Sensation intact distally Intact pulses distally Dorsiflexion/Plantar flexion intact Dressing - dressing C/D/I Motor Function - intact, moving foot and toes well on exam.   Past Medical History:  Diagnosis Date  . Arthritis   . Cancer ([place]) 2014   Breast CA Left   . Dehydration    with AKI  . DVT (deep venous thrombosis) ([place]) 01/18/2016    left leg  . Family history of adverse reaction to anesthesia   . GERD (gastroesophageal reflux disease)   . History of kidney stones 2006-2007  . Hypertension   . Hypothyroidism   . PONV (postoperative nausea and vomiting)    motion sickness    Assessment/Plan: 1 Day Post-Op Procedure(s) (LRB): TOTAL HIP ARTHROPLASTY ANTERIOR APPROACH (Right) Active Problems:   S/P right total hip arthroplasty  Estimated body mass index is 26.37 kg/m as calculated from the following:   Height as of this encounter: 5' (1.524 m).   Weight as of this encounter: 61.2 kg. Advance diet Up with therapy  DVT Prophylaxis - Aspirin Weight bearing as tolerated.  Plan is to go Home after hospital stay. Plan to get up with PT today. She may require another night stay in order to progress to a point of mobility that she will be safe at home with the assistance of her caregiver. If she makes great progression today, we will discharge. Otherwise, plan to stay until tomorrow.   Griffith Citron, PA-C Orthopedic Surgery [phone] 02/24/2021, 8:52 AM

## 2021-02-24 NOTE — Progress Notes (Signed)
Physical Therapy Treatment Patient Details Name: Teresa Kidd MRN: [id number] DOB: Feb 26, 1941 Today's Date: 02/24/2021    History of Present Illness Patient is 80 y.o. female s/p Rt THA anterior approach on 02/23/21 with PMH significant for HTN, hypothyroidism, GERD, OA, DVT, breast cancer.    PT Comments    Patient reports  She sat down hard onto toilet earlier  And feels right hip is sore.  Patient's son unable to assist after Dc.  Agree that another night and more PT visits will be beneficial.  Follow Up Recommendations  Follow surgeon's recommendation for DC plan and follow-up therapies;Home health PT     Equipment Recommendations  None recommended by PT    Recommendations for Other Services       Precautions / Restrictions Precautions Precautions: Fall Restrictions Weight Bearing Restrictions: Yes RLE Weight Bearing: Weight bearing as tolerated Other Position/Activity Restrictions: WBAT    Mobility  Bed Mobility   Bed Mobility: Supine to Sit     Supine to sit: Min assist;HOB elevated     General bed mobility comments: pt using bed rail and assist needed to bring Rt LE off EOB and fully raise trunk.    Transfers Overall transfer level: Needs assistance Equipment used: Rolling walker (2 wheeled) Transfers: Sit to/from Stand Sit to Stand: Min assist         General transfer comment: cues for hand placement, pt using single UE on EOB for power up. light assist to steady with rise., Use of armrests  on BSC over toilet  Ambulation/Gait Ambulation/Gait assistance: Min assist Gait Distance (Feet): 20 Feet (x 2) Assistive device: Rolling walker (2 wheeled) Gait Pattern/deviations: Step-to pattern;Decreased stride length;Decreased weight shift to right Gait velocity: decr   General Gait Details: min cues for step pattern and safe position to RW, pt maintained throughout. no overt LOB noted,   Stairs             Wheelchair Mobility    Modified Rankin  (Stroke Patients Only)       Balance   Sitting-balance support: Feet supported Sitting balance-Leahy Scale: Good     Standing balance support: During functional activity;Single extremity supported Standing balance-Leahy Scale: Fair                              Cognition Arousal/Alertness: Financial controller Sets: AROM;Both;10 reps Short Arc Quad: AROM;Right;10 reps;Supine Heel Slides: AAROM;Right;10 reps;Supine Hip ABduction/ADduction: AAROM;Right;10 reps;Supine    General Comments General comments (skin integrity, edema, etc.): stands and manipulates briefs and places pad  while standing      Pertinent Vitals/Pain Faces Pain Scale: Hurts little more Pain Location: Rt hip Pain Descriptors / Indicators: Discomfort Pain Intervention(s): Monitored during session;Ice applied;RN gave pain meds during session;Premedicated before session    Home Living                      Prior Function            PT Goals (current goals can now be found in the care plan section) Progress towards PT goals: Progressing toward goals    Frequency    7X/week  PT Plan Current plan remains appropriate    Co-evaluation              AM-PAC PT "6 Clicks" Mobility   Outcome Measure  Help needed turning from your back to your side while in a flat bed without using bedrails?: A Little Help needed moving from lying on your back to sitting on the side of a flat bed without using bedrails?: A Little Help needed moving to and from a bed to a chair (including a wheelchair)?: A Little Help needed standing up from a chair using your arms (e.g., wheelchair or bedside chair)?: A Little Help needed to walk in hospital room?: A Little Help needed climbing 3-5 steps with a railing? : A Lot 6 Click Score: 17    End of Session Equipment Utilized During Treatment: Gait  belt Activity Tolerance: Patient tolerated treatment well Patient left: in chair;with call bell/phone within reach;with chair alarm set Nurse Communication: Mobility status PT Visit Diagnosis: Muscle weakness (generalized) (M62.81);Difficulty in walking, not elsewhere classified (R26.2)     Time: 1000-1035 PT Time Calculation (min) (ACUTE ONLY): 35 min  Charges:  $Gait Training: 8-22 mins $Therapeutic Exercise: 8-22 mins                     Tresa Endo PT Acute Rehabilitation Services Pager [phone] Office [phone]    Claretha Cooper 02/24/2021, 10:40 AM

## 2021-02-25 ENCOUNTER — Encounter (HOSPITAL_COMMUNITY): Payer: Self-pay | Admitting: Student

## 2021-02-25 LAB — CBC
HCT: 25.6 % — ABNORMAL LOW (ref 36.0–46.0)
Hemoglobin: 7.9 g/dL — ABNORMAL LOW (ref 12.0–15.0)
MCH: 31.3 pg (ref 26.0–34.0)
MCHC: 30.9 g/dL (ref 30.0–36.0)
MCV: 101.6 fL — ABNORMAL HIGH (ref 80.0–100.0)
Platelets: 146 K/uL — ABNORMAL LOW (ref 150–400)
RBC: 2.52 MIL/uL — ABNORMAL LOW (ref 3.87–5.11)
RDW: 14.3 % (ref 11.5–15.5)
WBC: 12.8 K/uL — ABNORMAL HIGH (ref 4.0–10.5)
nRBC: 0 % (ref 0.0–0.2)

## 2021-02-25 MED ORDER — METHOCARBAMOL 500 MG PO TABS: 500.0000 mg | ORAL_TABLET | Freq: Four times a day (QID) | ORAL | 0 refills | Status: DC | PRN

## 2021-02-25 MED ORDER — ASPIRIN 81 MG PO CHEW: 81.0000 mg | CHEWABLE_TABLET | Freq: Two times a day (BID) | ORAL | 0 refills | Status: AC

## 2021-02-25 NOTE — Progress Notes (Signed)
Patient ID: Teresa Kidd, female   DOB: [birth year], 80 y.o.   MRN: [id number] Subjective: 2 Days Post-Op Procedure(s) (LRB): TOTAL HIP ARTHROPLASTY ANTERIOR APPROACH (Right)    Patient reports pain as mild.  Soreness with activity from yesterday.  Objective:   VITALS:   Vitals:   02/24/21 2231 02/25/21 0455  BP: (!) 99/45 122/64  Pulse: 76 70  Resp: 18 18  Temp: 97.6 F (36.4 C) (!) 97.2 F (36.2 C)  SpO2: 95% 96%    Neurovascular intact Incision: dressing C/D/I  LABS Recent Labs    02/24/21 0255 02/25/21 0320  HGB 9.3* 7.9*  HCT 29.5* 25.6*  WBC 11.0* 12.8*  PLT 170 146*    Recent Labs    02/24/21 0255  NA 140  K 4.2  BUN 13  CREATININE 0.94  GLUCOSE 138*    No results for input(s): LABPT, INR in the last 72 hours.   Assessment/Plan: 2 Days Post-Op Procedure(s) (LRB): TOTAL HIP ARTHROPLASTY ANTERIOR APPROACH (Right)   Up with therapy to review steps prior to discharge RTC in 2 weeks ? Need for bedside commode ASA for DVT prophylaxis plus increased activity

## 2021-02-25 NOTE — Progress Notes (Signed)
Physical Therapy Treatment Patient Details Name: Teresa Kidd MRN: [id number] DOB: November 21, 1940 Today's Date: 02/25/2021    History of Present Illness Patient is 80 y.o. female s/p Rt THA anterior approach on 02/23/21 with PMH significant for HTN, hypothyroidism, GERD, OA, DVT, breast cancer.    PT Comments    POD # 2 am session Assisted OOB.  General bed mobility comments: demonstrated and instructed how to use belt to self assist LE.  General transfer comment: 25% VC's on proper hand placement and safety with turns.  Also assisted to bathroom whee=re pt was able to self don/dof underpants safely in a standing position and perform self peri care.  Also demonstarted good safety awareness navigating in space. General Gait Details: good alternating gait with good safety cognition and proper walker use.  Amb a functional distance. General stair comments: 25% VC's on proper sequencing as well as safety using one R rail and one L cane. Number of Stairs: 2 Then returned to room to perform some TE's following HEP handout.  Instructed on proper tech, freq as well as use of ICE.  Addressed all mobility questions, discussed appropriate activity, educated on use of ICE.  Pt ready for D/C to home.    Follow Up Recommendations  Follow surgeon's recommendation for DC plan and follow-up therapies;Home health PT     Equipment Recommendations  None recommended by PT    Recommendations for Other Services       Precautions / Restrictions Precautions Precautions: Fall Restrictions Weight Bearing Restrictions: No Other Position/Activity Restrictions: WBAT    Mobility  Bed Mobility Overal bed mobility: Needs Assistance Bed Mobility: Supine to Sit     Supine to sit: Supervision;Min guard     General bed mobility comments: demonstrated and instructed how to use belt to self assist LE    Transfers Overall transfer level: Needs assistance Equipment used: Rolling walker (2 wheeled) Transfers: Sit  to/from [facility] Sit to Stand: Supervision         General transfer comment: 25% VC's on proper hand placement and safety with turns.  Also assisted to bathroom whee=re pt was able to self don/dof underpants safely in a standing position and perform self peri care.  Also demonstarted good safety awareness navigating in space.  Ambulation/Gait Ambulation/Gait assistance: Supervision Gait Distance (Feet): 125 Feet Assistive device: Rolling walker (2 wheeled) Gait Pattern/deviations: Step-through pattern Gait velocity: decr   General Gait Details: good alternating gait with good safety cognition and proper walker use.  Amb a functional distance.   Stairs Stairs: Yes Stairs assistance: Supervision;Min guard Stair Management: One rail Right;Step to pattern;Forwards;With cane Number of Stairs: 2 General stair comments: 25% VC's on proper sequencing as well as safety using one R rail and one L cane.   Wheelchair Mobility    Modified Rankin (Stroke Patients Only)       Balance                                            Cognition Arousal/Alertness: Awake/alert Behavior During Therapy: WFL for tasks assessed/performed Overall Cognitive Status: Within Functional Limits for tasks assessed                                 General Comments: AxO x 3 very pleasant      Exercises  Total Hip Replacement TE's following HEP Handout 10 reps ankle pumps 05 reps knee presses 05 reps heel slides 05 reps SAQ's 05 reps ABD Instructed how to use a belt loop to assist  Followed by ICE     General Comments        Pertinent Vitals/Pain Pain Assessment: 0-10 Pain Score: 5  Pain Location: Rt hip Pain Descriptors / Indicators: Discomfort;Aching;Operative site guarding Pain Intervention(s): Monitored during session;Premedicated before session;Repositioned;Ice applied    Home Living                      Prior Function             PT Goals (current goals can now be found in the care plan section) Progress towards PT goals: Progressing toward goals    Frequency    7X/week      PT Plan Current plan remains appropriate    Co-evaluation              AM-PAC PT "6 Clicks" Mobility   Outcome Measure  Help needed turning from your back to your side while in a flat bed without using bedrails?: A Little Help needed moving from lying on your back to sitting on the side of a flat bed without using bedrails?: A Little Help needed moving to and from a bed to a chair (including a wheelchair)?: A Little Help needed standing up from a chair using your arms (e.g., wheelchair or bedside chair)?: A Little Help needed to walk in hospital room?: A Little Help needed climbing 3-5 steps with a railing? : A Little 6 Click Score: 18    End of Session Equipment Utilized During Treatment: Gait belt Activity Tolerance: Patient tolerated treatment well Patient left: in chair;with call bell/phone within reach Nurse Communication: Mobility status (pt ready for D/C to home only needing one session today) PT Visit Diagnosis: Muscle weakness (generalized) (M62.81);Difficulty in walking, not elsewhere classified (R26.2)     Time: 9233-0076 PT Time Calculation (min) (ACUTE ONLY): 28 min  Charges:  $Gait Training: 8-22 mins $Therapeutic Exercise: 8-22 mins                     Rica Koyanagi  PTA Acute  Rehabilitation Services Pager      [phone] Office      [phone]

## 2021-03-01 NOTE — Discharge Summary (Signed)
Physician Discharge Summary   Patient ID: Teresa Kidd MRN: [id number] DOB/AGE: November 04, 1941 80 y.o.  Admit date: 02/23/2021 Discharge date: 02/25/2021  Primary Diagnosis: Right hip osteoarthritis.   Admission Diagnoses:  Past Medical History:  Diagnosis Date  . Arthritis   . Cancer ([place]) 2014   Breast CA Left   . Dehydration    with AKI  . DVT (deep venous thrombosis) ([place]) 01/18/2016   left leg  . Family history of adverse reaction to anesthesia   . GERD (gastroesophageal reflux disease)   . History of kidney stones 2006-2007  . Hypertension   . Hypothyroidism   . PONV (postoperative nausea and vomiting)    motion sickness   Discharge Diagnoses:   Active Problems:   S/P right total hip arthroplasty  Estimated body mass index is 26.37 kg/m as calculated from the following:   Height as of this encounter: 5' (1.524 m).   Weight as of this encounter: 61.2 kg.  Procedure:  Procedure(s) (LRB): TOTAL HIP ARTHROPLASTY ANTERIOR APPROACH (Right)   Consults: None  HPI: Teresa Kidd is a 80 y.o. female who had   presented to office for evaluation of right hip pain.  Radiographs revealed   progressive degenerative changes with bone-on-bone   articulation of the  hip joint, including subchondral cystic changes and osteophytes.  The patient had painful limited range of   motion significantly affecting their overall quality of life and function.  The patient was failing to    respond to conservative measures including medications and/or injections and activity modification and at this point was ready   to proceed with more definitive measures.  Consent was obtained for   benefit of pain relief.  Specific risks of infection, DVT, component   failure, dislocation, neurovascular injury, and need for revision surgery were reviewed in the office as well discussion of   the anterior versus posterior approach were reviewed.  Laboratory Data: Admission on 02/23/2021, Discharged on  02/25/2021  Component Date Value Ref Range Status  . WBC 02/24/2021 11.0* 4.0 - 10.5 K/uL Final  . RBC 02/24/2021 2.90* 3.87 - 5.11 MIL/uL Final  . Hemoglobin 02/24/2021 9.3* 12.0 - 15.0 g/dL Final  . HCT 02/24/2021 29.5* 36.0 - 46.0 % Final  . MCV 02/24/2021 101.7* 80.0 - 100.0 fL Final  . MCH 02/24/2021 32.1  26.0 - 34.0 pg Final  . MCHC 02/24/2021 31.5  30.0 - 36.0 g/dL Final  . RDW 02/24/2021 14.0  11.5 - 15.5 % Final  . Platelets 02/24/2021 170  150 - 400 K/uL Final  . nRBC 02/24/2021 0.0  0.0 - 0.2 % Final   Performed at [facility], [place] [address], [place], [place] [address]  . Sodium 02/24/2021 140  135 - 145 mmol/L Final  . Potassium 02/24/2021 4.2  3.5 - 5.1 mmol/L Final  . Chloride 02/24/2021 108  98 - 111 mmol/L Final  . CO2 02/24/2021 25  22 - 32 mmol/L Final  . Glucose, Bld 02/24/2021 138* 70 - 99 mg/dL Final   Glucose reference range applies only to samples taken after fasting for at least 8 hours.  . BUN 02/24/2021 13  8 - 23 mg/dL Final  . Creatinine, Ser 02/24/2021 0.94  0.44 - 1.00 mg/dL Final  . Calcium 02/24/2021 9.4  8.9 - 10.3 mg/dL Final  . GFR, Estimated 02/24/2021 >60  >60 mL/min Final   Comment: (NOTE) Calculated using the CKD-EPI Creatinine Equation (2021)   . Anion gap 02/24/2021 7  5 -  15 Final   Performed at [facility], [place] [address], [place], [place] [address]  . WBC 02/25/2021 12.8* 4.0 - 10.5 K/uL Final  . RBC 02/25/2021 2.52* 3.87 - 5.11 MIL/uL Final  . Hemoglobin 02/25/2021 7.9* 12.0 - 15.0 g/dL Final  . HCT 02/25/2021 25.6* 36.0 - 46.0 % Final  . MCV 02/25/2021 101.6* 80.0 - 100.0 fL Final  . MCH 02/25/2021 31.3  26.0 - 34.0 pg Final  . MCHC 02/25/2021 30.9  30.0 - 36.0 g/dL Final  . RDW 02/25/2021 14.3  11.5 - 15.5 % Final  . Platelets 02/25/2021 146* 150 - 400 K/uL Final  . nRBC 02/25/2021 0.0  0.0 - 0.2 % Final   Performed at [facility], [place] [address], [place], [place]  [address]  Hospital Outpatient Visit on 02/19/2021  Component Date Value Ref Range Status  . SARS Coronavirus 2 02/19/2021 NEGATIVE  NEGATIVE Final   Comment: (NOTE) SARS-CoV-2 target nucleic acids are NOT DETECTED.  The SARS-CoV-2 RNA is generally detectable in upper and lower respiratory specimens during the acute phase of infection. Negative results do not preclude SARS-CoV-2 infection, do not rule out co-infections with other pathogens, and should not be used as the sole basis for treatment or other patient management decisions. Negative results must be combined with clinical observations, patient history, and epidemiological information. The expected result is Negative.  Fact Sheet for Patients: SugarRoll.be  Fact Sheet for Healthcare Providers: https://www.woods-mathews.com/  This test is not yet approved or cleared by the [place] FDA and  has been authorized for detection and/or diagnosis of SARS-CoV-2 by FDA under an Emergency Use Authorization (EUA). This EUA will remain  in effect (meaning this test can be used) for the duration of the COVID-19 declaration under Se                          ction 564(b)(1) of the Act, 21 U.S.C. section 360bbb-3(b)(1), unless the authorization is terminated or revoked sooner.  Performed at [facility], [place] [address] [place], [place] [address]   Hospital Outpatient Visit on 02/10/2021  Component Date Value Ref Range Status  . MRSA, PCR 02/10/2021 POSITIVE* NEGATIVE Final   Comment: RESULT CALLED TO, READ BACK BY AND VERIFIED WITH: 04.07.22 @ 0900 BY MECIAL J. MOORE,M. RN    . Staphylococcus aureus 02/10/2021 POSITIVE* NEGATIVE Final   Comment: (NOTE) The Xpert SA Assay (FDA approved for NASAL specimens in patients 3 years of age and older), is one component of a comprehensive surveillance program. It is not intended to diagnose infection nor to guide or monitor  treatment. Performed at [facility], [place] [address], [place], [place] [address]   . WBC 02/10/2021 9.7  4.0 - 10.5 K/uL Final  . RBC 02/10/2021 3.86* 3.87 - 5.11 MIL/uL Final  . Hemoglobin 02/10/2021 12.2  12.0 - 15.0 g/dL Final  . HCT 02/10/2021 39.2  36.0 - 46.0 % Final  . MCV 02/10/2021 101.6* 80.0 - 100.0 fL Final  . MCH 02/10/2021 31.6  26.0 - 34.0 pg Final  . MCHC 02/10/2021 31.1  30.0 - 36.0 g/dL Final  . RDW 02/10/2021 13.8  11.5 - 15.5 % Final  . Platelets 02/10/2021 217  150 - 400 K/uL Final  . nRBC 02/10/2021 0.0  0.0 - 0.2 % Final   Performed at [facility], [place] [address], [place], [place] [address]  . Sodium 02/10/2021 144  135 - 145 mmol/L Final  . Potassium 02/10/2021 4.7  3.5 - 5.1 mmol/L Final  . Chloride 02/10/2021 109  98 - 111 mmol/L Final  . CO2 02/10/2021 26  22 - 32 mmol/L Final  . Glucose, Bld 02/10/2021 101* 70 - 99 mg/dL Final   Glucose reference range applies only to samples taken after fasting for at least 8 hours.  . BUN 02/10/2021 21  8 - 23 mg/dL Final  . Creatinine, Ser 02/10/2021 1.22* 0.44 - 1.00 mg/dL Final  . Calcium 02/10/2021 10.5* 8.9 - 10.3 mg/dL Final  . Total Protein 02/10/2021 7.2  6.5 - 8.1 g/dL Final  . Albumin 02/10/2021 4.2  3.5 - 5.0 g/dL Final  . AST 02/10/2021 24  15 - 41 U/L Final  . ALT 02/10/2021 19  0 - 44 U/L Final  . Alkaline Phosphatase 02/10/2021 45  38 - 126 U/L Final  . Total Bilirubin 02/10/2021 0.6  0.3 - 1.2 mg/dL Final  . GFR, Estimated 02/10/2021 45* >60 mL/min Final   Comment: (NOTE) Calculated using the CKD-EPI Creatinine Equation (2021)   . Anion gap 02/10/2021 9  5 - 15 Final   Performed at [facility], [place] [address] [place], [place] [address]  . Prothrombin Time 02/10/2021 11.7  11.4 - 15.2 seconds Final  . INR 02/10/2021 0.9  0.8 - 1.2 Final   Comment: (NOTE) INR goal varies based on device and disease states. Performed at [facility], [place] [address], [place], [place] [address]   . aPTT 02/10/2021 27  24 - 36 seconds Final   Performed at [facility], [place] [address] [place], [place] [address]  . ABO/RH(D) 02/10/2021 A NEG   Final  . Antibody Screen 02/10/2021 NEG   Final  . Sample Expiration 02/10/2021 02/24/2021,2359   Final  . Extend sample reason 02/10/2021    Final                   Value:NO TRANSFUSIONS OR PREGNANCY IN THE PAST 3 MONTHS Performed at [facility], [place] [address] [place], [place] [address]      X-Rays:DG C-Arm 1-60 Min-No Report  Result Date: 02/23/2021 CLINICAL DATA:  Right hip arthroplasty EXAM: OPERATIVE RIGHT HIP (WITH PELVIS IF PERFORMED) OPERATIVE VIEWS TECHNIQUE: Fluoroscopic spot image(s) were submitted for interpretation post-operatively. COMPARISON:  03/27/2006 FINDINGS: 4 C-arm fluoroscopic images were obtained intraoperatively and submitted for post operative interpretation. Images obtained during placement of right total hip arthroplasty hardware. 8 seconds of fluoroscopy time was utilized. Please see the performing provider's procedural report for further detail. IMPRESSION: As above. Electronically Signed   By: Davina Poke D.O.   On: 02/23/2021 11:05   DG HIP OPERATIVE UNILAT W OR W/O PELVIS RIGHT  Result Date: 02/23/2021 CLINICAL DATA:  Right hip arthroplasty EXAM: OPERATIVE RIGHT HIP (WITH PELVIS IF PERFORMED) OPERATIVE VIEWS TECHNIQUE: Fluoroscopic spot image(s) were submitted for interpretation post-operatively. COMPARISON:  03/27/2006 FINDINGS: 4 C-arm fluoroscopic images were obtained intraoperatively and submitted for post operative interpretation. Images obtained during placement of right total hip arthroplasty hardware. 8 seconds of fluoroscopy time was utilized. Please see the performing provider's procedural report for further detail. IMPRESSION: As above. Electronically Signed   By: Davina Poke D.O.   On: 02/23/2021 11:05     EKG: Orders placed or performed during the hospital encounter of 10/24/16  . EKG 12-Lead  . EKG 12-Lead     Hospital Course: Teresa Kidd is  a 80 y.o. who was admitted to [facility]. They were brought to the operating room on 02/23/2021 and underwent Procedure(s): TOTAL HIP ARTHROPLASTY ANTERIOR APPROACH.  Patient tolerated the procedure well and was later transferred to the recovery room and then to the orthopaedic floor for postoperative care. They were given PO and IV analgesics for pain control following their surgery. They were given 24 hours of postoperative antibiotics of  Anti-infectives (From admission, onward)   Start     Dose/Rate Route Frequency Ordered Stop   02/24/21 1000  hydroxychloroquine (PLAQUENIL) tablet 200 mg  Status:  Discontinued        200 mg Oral Daily 02/23/21 1049 02/25/21 2138   02/23/21 1330  ceFAZolin (ANCEF) IVPB 2g/100 mL premix        2 g 200 mL/hr over 30 Minutes Intravenous Every 6 hours 02/23/21 1049 02/23/21 2055   02/23/21 0600  ceFAZolin (ANCEF) IVPB 2g/100 mL premix        2 g 200 mL/hr over 30 Minutes Intravenous On call to O.R. 02/23/21 0516 02/23/21 0725   02/23/21 0530  vancomycin (VANCOCIN) IVPB 1000 mg/200 mL premix        1,000 mg 200 mL/hr over 60 Minutes Intravenous  Once 02/23/21 0516 02/23/21 0732     and started on DVT prophylaxis in the form of Aspirin.   PT and OT were ordered for total joint protocol. Discharge planning consulted to help with postop disposition and equipment needs. Patient had a good night on the evening of surgery. They started to get up OOB with therapy on POD #0. Her son had to have emergent foot amputation, which changed her care plan at home. She continued to work with therapy on POD #1. Continued to work with therapy into POD #2. Pt was seen during rounds on day two and was ready to go home pending progress with therapy. Pt worked with therapy for one additional session and was meeting their goals. She  was discharged to home later that day in stable condition.  Diet: Regular diet Activity: WBAT Follow-up: in 2 weeks Disposition: Home Discharged Condition: good   Discharge Instructions    Call MD / Call 911   Complete by: As directed    If you experience chest pain or shortness of breath, CALL 911 and be transported to the hospital emergency room.  If you develope a fever above 101 F, pus (white drainage) or increased drainage or redness at the wound, or calf pain, call your surgeon's office.   Change dressing   Complete by: As directed    Maintain surgical dressing until follow up in the clinic. If the edges start to pull up, may reinforce with tape. If the dressing is no longer working, may remove and cover with gauze and tape, but must keep the area dry and clean.  Call with any questions or concerns.   Constipation Prevention   Complete by: As directed    Drink plenty of fluids.  Prune juice may be helpful.  You may use a stool softener, such as Colace (over the counter) 100 mg twice a day.  Use MiraLax (over the counter) for constipation as needed.   Diet - low sodium heart healthy   Complete by: As directed    Increase activity slowly as tolerated   Complete by: As directed    Weight bearing as tolerated with assist device (walker, cane, etc) as directed, use it as long as suggested by your surgeon or therapist,  typically at least 4-6 weeks.   Post-operative opioid taper instructions:   Complete by: As directed    POST-OPERATIVE OPIOID TAPER INSTRUCTIONS: It is important to wean off of your opioid medication as soon as possible. If you do not need pain medication after your surgery it is ok to stop day one. Opioids include: Codeine, Hydrocodone(Norco, Vicodin), Oxycodone(Percocet, oxycontin) and hydromorphone amongst others.  Long term and even short term use of opiods can cause: Increased pain response Dependence Constipation Depression Respiratory depression And more.   Withdrawal symptoms can include Flu like symptoms Nausea, vomiting And more Techniques to manage these symptoms Hydrate well Eat regular healthy meals Stay active Use relaxation techniques(deep breathing, meditating, yoga) Do Not substitute Alcohol to help with tapering If you have been on opioids for less than two weeks and do not have pain than it is ok to stop all together.  Plan to wean off of opioids This plan should start within one week post op of your joint replacement. Maintain the same interval or time between taking each dose and first decrease the dose.  Cut the total daily intake of opioids by one tablet each day Next start to increase the time between doses. The last dose that should be eliminated is the evening dose.      TED hose   Complete by: As directed    Use stockings (TED hose) for 2 weeks on both leg(s).  You may remove them at night for sleeping.     Allergies as of 02/25/2021      Reactions   Feldene [piroxicam] Itching   In palm of hands & bottom of feets      Medication List    STOP taking these medications   acetaminophen 500 MG tablet Commonly known as: TYLENOL     TAKE these medications   aspirin 81 MG chewable tablet Chew 1 tablet (81 mg total) by mouth 2 (two) times daily for 28 days.   B-12 5000 MCG Caps Take 5,000 mcg by mouth daily.   Calcium 600+D 600-800 MG-UNIT Tabs Generic drug: Calcium Carb-Cholecalciferol Take 1 tablet by mouth daily.   CENTRUM SILVER 50+WOMEN PO Take 1 tablet by mouth daily.   denosumab 60 MG/ML Soln injection Commonly known as: PROLIA Inject 60 mg into the skin every 6 (six) months. Administer in upper arm, thigh, or abdomen  Next dose due 11/2017   DULoxetine 30 MG capsule Commonly known as: CYMBALTA Take 30 mg by mouth daily.   folic acid Q000111Q MCG tablet Commonly known as: FOLVITE Take 800 mcg by mouth daily.   gabapentin 600 MG tablet Commonly known as: NEURONTIN Take 600 mg by mouth 3  (three) times daily.   Ginger Root 550 MG Caps Take 550 mg by mouth daily.   hydroxychloroquine 200 MG tablet Commonly known as: PLAQUENIL Take 200 mg by mouth daily.   levothyroxine 75 MCG tablet Commonly known as: SYNTHROID Take 75 mcg by mouth daily before breakfast.   lisinopril 5 MG tablet Commonly known as: ZESTRIL Take 5 mg by mouth daily.   Magnesium 250 MG Tabs Take 250 mg by mouth daily.   methocarbamol 500 MG tablet Commonly known as: ROBAXIN Take 1 tablet (500 mg total) by mouth every 6 (six) hours as needed for muscle spasms.   omeprazole 20 MG capsule Commonly known as: PRILOSEC Take 20 mg by mouth daily.   predniSONE 5 MG tablet Commonly known as: DELTASONE Take 5 mg by mouth daily with breakfast.  simvastatin 40 MG tablet Commonly known as: ZOCOR Take 40 mg by mouth daily.   Turmeric Curcumin 500 MG Caps Take 500 mg by mouth daily.   Vitamin D3 25 MCG (1000 UT) Caps Take 1,000 Units by mouth daily.            Discharge Care Instructions  (From admission, onward)         Start     Ordered   02/25/21 0000  Change dressing       Comments: Maintain surgical dressing until follow up in the clinic. If the edges start to pull up, may reinforce with tape. If the dressing is no longer working, may remove and cover with gauze and tape, but must keep the area dry and clean.  Call with any questions or concerns.   02/25/21 1413          Follow-up Information    Paralee Cancel, MD. Schedule an appointment as soon as possible for a visit in 2 weeks.   Specialty: Orthopedic Surgery Contact information: [address] [place] [address] [phone]               Signed: Griffith Citron, PA-C Orthopedic Surgery 03/01/2021, 8:18 AM

## 2021-06-17 ENCOUNTER — Other Ambulatory Visit: Payer: Self-pay | Admitting: Orthopedic Surgery

## 2021-06-17 DIAGNOSIS — M19012 Primary osteoarthritis, left shoulder: Secondary | ICD-10-CM

## 2021-07-15 ENCOUNTER — Ambulatory Visit
Admission: RE | Admit: 2021-07-15 | Discharge: 2021-07-15 | Disposition: A | Discharge status: Home / Self Care | Payer: Medicare Other | Source: Ambulatory Visit | Attending: Orthopedic Surgery | Admitting: Orthopedic Surgery

## 2021-07-15 DIAGNOSIS — M19012 Primary osteoarthritis, left shoulder: Secondary | ICD-10-CM

## 2021-07-30 NOTE — Pre-Procedure Instructions (Addendum)
Surgical Instructions    Your procedure is scheduled on Friday, August 13, 2021 at 12:30 PM.  Report to [facility] Main Entrance "A" at 10:30 A.M., then check in with the Admitting office.  Call this number if you have problems the morning of surgery:  [phone]   If you have any questions prior to your surgery date call [phone]: Open Monday-Friday 8am-4pm    Remember:  Do not eat after midnight the night before your surgery  You may drink clear liquids until 9:30 AM the morning of your surgery.   Clear liquids allowed are: Water, Non-Citrus Juices (without pulp), Carbonated Beverages, Clear Tea, Black Coffee Only, and Gatorade   Enhanced Recovery after Surgery for Orthopedics Enhanced Recovery after Surgery is a protocol used to improve the stress on your body and your recovery after surgery.  Patient Instructions  The day of surgery (if you do NOT have diabetes):  Drink ONE (1) Pre-Surgery Clear Ensure by _9:30_ am the morning of surgery   This drink was given to you during your hospital  pre-op appointment visit. Nothing else to drink after completing the  Pre-Surgery Clear Ensure.         If you have questions, please contact your surgeon's office.     Take these medicines the morning of surgery with A SIP OF WATER:  DULoxetine (CYMBALTA)  gabapentin (NEURONTIN)  levothyroxine (SYNTHROID, LEVOTHROID) omeprazole (PRILOSEC) predniSONE (DELTASONE) simvastatin (ZOCOR)  Follow your surgeon's instructions on whether or not to stop taking hydroxychloroquine (PLAQUENIL).   As of today, STOP taking any Aspirin (unless otherwise instructed by your surgeon) Aleve, Naproxen, Ibuprofen, Motrin, Advil, Goody's, BC's, all herbal medications, fish oil, and all vitamins.                     Do NOT Smoke (Tobacco/Vaping) or drink Alcohol 24 hours prior to your procedure.  If you use a CPAP at night, you may bring all equipment for your overnight stay.   Contacts,  glasses, piercing's, hearing aid's, dentures or partials may not be worn into surgery, please bring cases for these belongings.    For patients admitted to the hospital, discharge time will be determined by your treatment team.   Patients discharged the day of surgery will not be allowed to drive home, and someone needs to stay with them for 24 hours.  ONLY 1 SUPPORT PERSON MAY BE PRESENT WHILE YOU ARE IN SURGERY. IF YOU ARE TO BE ADMITTED ONCE YOU ARE IN YOUR ROOM YOU WILL BE ALLOWED TWO (2) VISITORS.  Minor children may have two parents present. Special consideration for safety and communication needs will be reviewed on a case by case basis.                                    [place]- Preparing for Total Shoulder Arthroplasty   Before surgery, you can play an important role. Because skin is not sterile, your skin needs to be as free of germs as possible. You can reduce the number of germs on your skin by using the following products. Benzoyl Peroxide Gel Reduces the number of germs present on the skin Applied twice a day to shoulder area starting two days before surgery   Chlorhexidine Gluconate (CHG) Soap An antiseptic cleaner that kills germs and bonds with the skin to continue killing germs even after washing Used for showering the night before surgery  and morning of surgery   Oral Hygiene is also important to reduce your risk of infection.                                    Remember - BRUSH YOUR TEETH THE MORNING OF SURGERY WITH YOUR REGULAR TOOTHPASTE  ==================================================================  Please follow these instructions carefully:  BENZOYL PEROXIDE 5% GEL  Please do not use if you have an allergy to benzoyl peroxide.   If your skin becomes reddened/irritated stop using the benzoyl peroxide.  Starting two days before surgery, apply as follows: Apply benzoyl peroxide in the morning and at night. Apply after taking a shower. If you are not  taking a shower clean entire shoulder front, back, and side along with the armpit with a clean wet washcloth.  Place a quarter-sized dollop on your shoulder and rub in thoroughly, making sure to cover the front, back, and side of your shoulder, along with the armpit.   2 days before ____ AM   ____ PM              1 day before ____ AM   ____ PM                             Do this twice a day for two days.  (Last application is the night before surgery, AFTER using the CHG soap as described below).  Do NOT apply benzoyl peroxide gel on the day of surgery.  CHLORHEXIDINE GLUCONATE (CHG) SOAP  Please do not use if you have an allergy to CHG or antibacterial soaps. If your skin becomes reddened/irritated stop using the CHG.   Do not shave (including legs and underarms) for at least 48 hours prior to first CHG shower. It is OK to shave your face.  Starting the night before surgery, use CHG soap as follows:  Shower the NIGHT BEFORE SURGERY and MORNING OF SURGERY with CHG.  If you choose to wash your hair, wash your hair first as usual with your normal shampoo.  After shampooing, rinse your hair and body thoroughly to remove the shampoo.  Use CHG as you would any other liquid soap.  You can apply CHG directly to the skin and wash gently with a scrungie or a clean washcloth.  Apply the CHG soap to your body ONLY FROM THE NECK DOWN.  Do not use on open wounds or open sores.  Avoid contact with your eyes, ears, mouth, and genitals (private parts).  Wash face and genitals (private parts) with your normal soap.  Wash thoroughly, paying special attention to the area where your surgery will be performed.  Thoroughly rinse your body with warm water from the neck down.  DO NOT shower/wash with your normal soap after using and rinsing off the CHG soap.   Pat yourself dry with a CLEAN TOWEL.    Apply benzoyl peroxide.   Wear CLEAN PAJAMAS to bed the night before surgery; wear comfortable  clothes the morning of surgery.  Place CLEAN SHEETS on your bed the night of your first shower and DO NOT SLEEP WITH PETS.    Day of Surgery: Shower with CHG soap. Do not wear jewelry, make up, nail polish, gel polish, artificial nails, or any other type of covering on natural nails including finger and toenails. If patients have artificial nails, gel coating, etc. that need  to be removed by a nail salon please have this removed prior to surgery. Surgery may need to be canceled/delayed if the surgeon/ anesthesia feels like the patient is unable to be adequately monitored. Do not wear lotions, powders, perfumes, or deodorant. Do not shave 48 hours prior to surgery. Do not bring valuables to the hospital. [facility] is not responsible for any belongings or valuables. Wear Clean/Comfortable clothing the morning of surgery Remember to brush your teeth WITH YOUR REGULAR TOOTHPASTE.   Please read over the following fact sheets that you were given.

## 2021-08-02 ENCOUNTER — Inpatient Hospital Stay (HOSPITAL_COMMUNITY)
Admission: RE | Admit: 2021-08-02 | Discharge: 2021-08-02 | Disposition: A | Discharge status: Home / Self Care | Payer: Medicare Other | Source: Ambulatory Visit

## 2021-08-09 NOTE — Progress Notes (Signed)
Surgical Instructions    Your procedure is scheduled on Friday, October 7th.  Report to [facility] Main Entrance "A" at 10:15 A.M., then check in with the Admitting office.  Call this number if you have problems the morning of surgery:  [phone]   If you have any questions prior to your surgery date call [phone]: Open Monday-Friday 8am-4pm    Remember:  Do not eat after midnight the night before your surgery  You may drink clear liquids until 9:15 AM the morning of your surgery.   Clear liquids allowed are: Water, Non-Citrus Juices (without pulp), Carbonated Beverages, Clear Tea, Black Coffee ONLY (NO MILK, CREAM OR POWDERED CREAMER of any kind), and Gatorade  Please complete your PRE-SURGERY ENSURE that was provided to you by 9:15 AM the morning of surgery.  Please, if able, drink it in one setting. DO NOT SIP.     Take these medicines the morning of surgery with A SIP OF WATER Duloxetine (Cymbalta) Gabapentin (Neurontin) Levothyroxine (Synthroid)  Omeprazole (Prilosec)  Prednisone   Simvastatin (Zocor)  In regards to Plaquenil, you may continue taking, unless instructed differently by your surgeon.    As of today, STOP taking any Aspirin (unless otherwise instructed by your surgeon) Aleve, Naproxen, Ibuprofen, Motrin, Advil, Goody's, BC's, all herbal medications, fish oil, and all vitamins.          Do not wear jewelry, makeup, or nail polish Do not wear lotions, powders, perfumes, or deodorant. Do not shave 48 hours prior to surgery.   Do not bring valuables to the hospital.             [facility] is not responsible for any belongings or valuables.  Do NOT Smoke (Tobacco/Vaping)  24 hours prior to your procedure If you use a CPAP at night, you may bring your mask for your overnight stay.   Contacts, glasses, dentures or bridgework may not be worn into surgery, please bring cases for these belongings   For patients admitted to the hospital, discharge time will  be determined by your treatment team.   Patients discharged the day of surgery will not be allowed to drive home, and someone needs to stay with them for 24 hours.  NO VISITORS WILL BE ALLOWED IN PRE-OP WHERE PATIENTS ARE PREPPED FOR SURGERY.  ONLY 1 SUPPORT PERSON MAY BE PRESENT IN THE WAITING ROOM WHILE YOU ARE IN SURGERY.  IF YOU ARE TO BE ADMITTED, ONCE YOU ARE IN YOUR ROOM YOU WILL BE ALLOWED TWO (2) VISITORS. 1 (ONE) VISITOR MAY STAY OVERNIGHT BUT MUST ARRIVE TO THE ROOM BY 8pm.  Minor children may have two parents present. Special consideration for safety and communication needs will be reviewed on a case by case basis.                                    [place]- Preparing for Total Shoulder Arthroplasty   Before surgery, you can play an important role. Because skin is not sterile, your skin needs to be as free of germs as possible. You can reduce the number of germs on your skin by using the following products. Benzoyl Peroxide Gel Reduces the number of germs present on the skin Applied twice a day to shoulder area starting two days before surgery   Chlorhexidine Gluconate (CHG) Soap An antiseptic cleaner that kills germs and bonds with the skin to continue killing germs even after  washing Used for showering the night before surgery and morning of surgery   Oral Hygiene is also important to reduce your risk of infection.                                    Remember - BRUSH YOUR TEETH THE MORNING OF SURGERY WITH YOUR REGULAR TOOTHPASTE  ==================================================================  Please follow these instructions carefully:  BENZOYL PEROXIDE 5% GEL  Please do not use if you have an allergy to benzoyl peroxide.   If your skin becomes reddened/irritated stop using the benzoyl peroxide.  Starting two days before surgery, apply as follows: Apply benzoyl peroxide in the morning and at night. Apply after taking a shower. If you are not taking a shower clean  entire shoulder front, back, and side along with the armpit with a clean wet washcloth.  Place a quarter-sized dollop on your shoulder and rub in thoroughly, making sure to cover the front, back, and side of your shoulder, along with the armpit.   2 days before ____ AM   ____ PM              1 day before ____ AM   ____ PM                          Do this twice a day for two days.  (Last application is the night before surgery, AFTER using the CHG soap as described below).  Do NOT apply benzoyl peroxide gel on the day of surgery.  CHLORHEXIDINE GLUCONATE (CHG) SOAP  Please do not use if you have an allergy to CHG or antibacterial soaps. If your skin becomes reddened/irritated stop using the CHG.   Do not shave (including legs and underarms) for at least 48 hours prior to first CHG shower. It is OK to shave your face.  Starting the night before surgery, use CHG soap as follows:  Shower the NIGHT BEFORE SURGERY and MORNING OF SURGERY with CHG.  If you choose to wash your hair, wash your hair first as usual with your normal shampoo.  After shampooing, rinse your hair and body thoroughly to remove the shampoo.  Use CHG as you would any other liquid soap.  You can apply CHG directly to the skin and wash gently with a scrungie or a clean washcloth.  Apply the CHG soap to your body ONLY FROM THE NECK DOWN.  Do not use on open wounds or open sores.  Avoid contact with your eyes, ears, mouth, and genitals (private parts).  Wash face and genitals (private parts) with your normal soap.  Wash thoroughly, paying special attention to the area where your surgery will be performed.  Thoroughly rinse your body with warm water from the neck down.  DO NOT shower/wash with your normal soap after using and rinsing off the CHG soap.   Pat yourself dry with a CLEAN TOWEL.    Apply benzoyl peroxide.   Wear CLEAN PAJAMAS to bed the night before surgery; wear comfortable clothes the morning of  surgery.  Place CLEAN SHEETS on your bed the night of your first shower and DO NOT SLEEP WITH PETS.  Day of Surgery: Shower as above Do not apply any deodorants/lotions.  Please wear clean clothes to the hospital/surgery center.   Remember to brush your teeth WITH YOUR REGULAR TOOTHPASTE.     [place]- Preparing For  Surgery  Before surgery, you can play an important role. Because skin is not sterile, your skin needs to be as free of germs as possible. You can reduce the number of germs on your skin by washing with CHG (chlorahexidine gluconate) Soap before surgery.  CHG is an antiseptic cleaner which kills germs and bonds with the skin to continue killing germs even after washing.     Please do not use if you have an allergy to CHG or antibacterial soaps. If your skin becomes reddened/irritated stop using the CHG.  Do not shave (including legs and underarms) for at least 48 hours prior to first CHG shower. It is OK to shave your face.  Please follow these instructions carefully.     Shower the NIGHT BEFORE SURGERY and the MORNING OF SURGERY with CHG Soap.   If you chose to wash your hair, wash your hair first as usual with your normal shampoo. After you shampoo, rinse your hair and body thoroughly to remove the shampoo.  Then [employer] and genitals (private parts) with your normal soap and rinse thoroughly to remove soap.  After that Use CHG Soap as you would any other liquid soap. You can apply CHG directly to the skin and wash gently with a scrungie or a clean washcloth.   Apply the CHG Soap to your body ONLY FROM THE NECK DOWN.  Do not use on open wounds or open sores. Avoid contact with your eyes, ears, mouth and genitals (private parts). Wash Face and genitals (private parts)  with your normal soap.   Wash thoroughly, paying special attention to the area where your surgery will be performed.  Thoroughly rinse your body with warm water from the neck down.  DO NOT shower/wash  with your normal soap after using and rinsing off the CHG Soap.  Pat yourself dry with a CLEAN TOWEL.  Wear CLEAN PAJAMAS to bed the night before surgery  Place CLEAN SHEETS on your bed the night before your surgery  DO NOT SLEEP WITH PETS.   Day of Surgery:  Take a shower with CHG soap. Wear Clean/Comfortable clothing the morning of surgery Do not apply any deodorants/lotions.   Remember to brush your teeth WITH YOUR REGULAR TOOTHPASTE.   Please read over the following fact sheets that you were given.

## 2021-08-10 ENCOUNTER — Encounter (HOSPITAL_COMMUNITY)
Admission: RE | Admit: 2021-08-10 | Discharge: 2021-08-10 | Disposition: A | Discharge status: Home / Self Care | Payer: Medicare Other | Source: Ambulatory Visit | Attending: Orthopedic Surgery | Admitting: Orthopedic Surgery

## 2021-08-10 ENCOUNTER — Encounter (HOSPITAL_COMMUNITY): Payer: Self-pay

## 2021-08-10 ENCOUNTER — Other Ambulatory Visit: Payer: Self-pay

## 2021-08-10 DIAGNOSIS — Z01818 Encounter for other preprocedural examination: Secondary | ICD-10-CM | POA: Diagnosis present

## 2021-08-10 HISTORY — DX: Anemia, unspecified: D64.9

## 2021-08-10 LAB — SURGICAL PCR SCREEN
MRSA, PCR: NEGATIVE
Staphylococcus aureus: NEGATIVE

## 2021-08-10 LAB — CBC
HCT: 37.3 % (ref 36.0–46.0)
Hemoglobin: 11.8 g/dL — ABNORMAL LOW (ref 12.0–15.0)
MCH: 29.7 pg (ref 26.0–34.0)
MCHC: 31.6 g/dL (ref 30.0–36.0)
MCV: 94 fL (ref 80.0–100.0)
Platelets: 252 10*3/uL (ref 150–400)
RBC: 3.97 MIL/uL (ref 3.87–5.11)
RDW: 15.9 % — ABNORMAL HIGH (ref 11.5–15.5)
WBC: 11.1 10*3/uL — ABNORMAL HIGH (ref 4.0–10.5)
nRBC: 0 % (ref 0.0–0.2)

## 2021-08-10 LAB — BASIC METABOLIC PANEL
Anion gap: 9 (ref 5–15)
BUN: 22 mg/dL (ref 8–23)
CO2: 23 mmol/L (ref 22–32)
Calcium: 9.1 mg/dL (ref 8.9–10.3)
Chloride: 106 mmol/L (ref 98–111)
Creatinine, Ser: 1.34 mg/dL — ABNORMAL HIGH (ref 0.44–1.00)
GFR, Estimated: 40 mL/min — ABNORMAL LOW (ref >60)
Glucose, Bld: 100 mg/dL — ABNORMAL HIGH (ref 70–99)
Potassium: 4.6 mmol/L (ref 3.5–5.1)
Sodium: 138 mmol/L (ref 135–145)

## 2021-08-10 NOTE — Progress Notes (Signed)
PCP: Tamera Punt, MD in [place] Cardiologist: Cleora Fleet, MD in [place].  Has seen for blood clot  EKG: 08/10/21 CXR: na ECHO: denies Stress Test: denies Cardiac Cath: denies  Fasting Blood Sugar- na Checks Blood Sugar__na_ times a day  OSA/CPAP: No  ASA/Blood Thinners: No  Covid test not needed  Anesthesia Review: No  Patient denies shortness of breath, fever, cough, and chest pain at PAT appointment.  Patient verbalized understanding of instructions provided today at the PAT appointment.  Patient asked to review instructions at home and day of surgery.

## 2021-08-13 ENCOUNTER — Observation Stay (HOSPITAL_COMMUNITY)
Admission: RE | Admit: 2021-08-13 | Discharge: 2021-08-14 | Disposition: A | Discharge status: Home / Self Care | Payer: Medicare Other | Attending: Orthopedic Surgery | Admitting: Orthopedic Surgery

## 2021-08-13 ENCOUNTER — Other Ambulatory Visit: Payer: Self-pay

## 2021-08-13 ENCOUNTER — Encounter (HOSPITAL_COMMUNITY)
Admission: RE | Disposition: A | Discharge status: Home / Self Care | Payer: Self-pay | Source: Home / Self Care | Attending: Orthopedic Surgery

## 2021-08-13 ENCOUNTER — Ambulatory Visit (HOSPITAL_COMMUNITY): Payer: Medicare Other | Admitting: Anesthesiology

## 2021-08-13 ENCOUNTER — Observation Stay (HOSPITAL_COMMUNITY): Payer: Medicare Other

## 2021-08-13 ENCOUNTER — Encounter (HOSPITAL_COMMUNITY): Payer: Self-pay | Admitting: Orthopedic Surgery

## 2021-08-13 DIAGNOSIS — Z853 Personal history of malignant neoplasm of breast: Secondary | ICD-10-CM | POA: Diagnosis not present

## 2021-08-13 DIAGNOSIS — E039 Hypothyroidism, unspecified: Secondary | ICD-10-CM | POA: Diagnosis not present

## 2021-08-13 DIAGNOSIS — M19012 Primary osteoarthritis, left shoulder: Secondary | ICD-10-CM | POA: Diagnosis not present

## 2021-08-13 DIAGNOSIS — Z79899 Other long term (current) drug therapy: Secondary | ICD-10-CM | POA: Diagnosis not present

## 2021-08-13 DIAGNOSIS — Z96612 Presence of left artificial shoulder joint: Secondary | ICD-10-CM

## 2021-08-13 DIAGNOSIS — I10 Essential (primary) hypertension: Secondary | ICD-10-CM | POA: Diagnosis not present

## 2021-08-13 HISTORY — PX: REVERSE SHOULDER ARTHROPLASTY: SHX5054

## 2021-08-13 LAB — CBC
HCT: 36.7 % (ref 36.0–46.0)
Hemoglobin: 11.3 g/dL — ABNORMAL LOW (ref 12.0–15.0)
MCH: 29 pg (ref 26.0–34.0)
MCHC: 30.8 g/dL (ref 30.0–36.0)
MCV: 94.1 fL (ref 80.0–100.0)
Platelets: 261 10*3/uL (ref 150–400)
RBC: 3.9 MIL/uL (ref 3.87–5.11)
RDW: 16.4 % — ABNORMAL HIGH (ref 11.5–15.5)
WBC: 16.9 10*3/uL — ABNORMAL HIGH (ref 4.0–10.5)
nRBC: 0 % (ref 0.0–0.2)

## 2021-08-13 LAB — CREATININE, SERUM
Creatinine, Ser: 1.21 mg/dL — ABNORMAL HIGH (ref 0.44–1.00)
GFR, Estimated: 45 mL/min — ABNORMAL LOW (ref >60)

## 2021-08-13 SURGERY — ARTHROPLASTY, SHOULDER, TOTAL, REVERSE
Anesthesia: Regional | Site: Shoulder | Laterality: Left

## 2021-08-13 MED ORDER — FENTANYL CITRATE (PF) 100 MCG/2ML IJ SOLN
INTRAMUSCULAR | Status: AC
  Filled 2021-08-13: qty 2

## 2021-08-13 MED ORDER — 0.9 % SODIUM CHLORIDE (POUR BTL) OPTIME
TOPICAL | Status: DC | PRN
  Administered 2021-08-13 (×2): 1000 mL

## 2021-08-13 MED ORDER — SIMVASTATIN 20 MG PO TABS
40.0000 mg | ORAL_TABLET | Freq: Every day | ORAL | Status: DC
  Administered 2021-08-13 – 2021-08-14 (×2): 40 mg via ORAL
  Filled 2021-08-13 (×2): qty 2

## 2021-08-13 MED ORDER — LACTATED RINGERS IV SOLN: INTRAVENOUS | Status: DC

## 2021-08-13 MED ORDER — BUPIVACAINE LIPOSOME 1.3 % IJ SUSP
INTRAMUSCULAR | Status: DC | PRN
  Administered 2021-08-13: 10 mL via PERINEURAL

## 2021-08-13 MED ORDER — FENTANYL CITRATE (PF) 250 MCG/5ML IJ SOLN
INTRAMUSCULAR | Status: AC
  Filled 2021-08-13: qty 5

## 2021-08-13 MED ORDER — PHENYLEPHRINE HCL-NACL 20-0.9 MG/250ML-% IV SOLN
INTRAVENOUS | Status: DC | PRN
  Administered 2021-08-13: 30 ug/min via INTRAVENOUS

## 2021-08-13 MED ORDER — CEFAZOLIN SODIUM-DEXTROSE 1-4 GM/50ML-% IV SOLN
1.0000 g | Freq: Four times a day (QID) | INTRAVENOUS | Status: AC
Start: 2021-08-13 — End: 2021-08-14
  Administered 2021-08-13 – 2021-08-14 (×3): 1 g via INTRAVENOUS
  Filled 2021-08-13 (×3): qty 50

## 2021-08-13 MED ORDER — LIDOCAINE HCL (CARDIAC) PF 100 MG/5ML IV SOSY
PREFILLED_SYRINGE | INTRAVENOUS | Status: DC | PRN
  Administered 2021-08-13: 40 mg via INTRAVENOUS

## 2021-08-13 MED ORDER — TRANEXAMIC ACID-NACL 1000-0.7 MG/100ML-% IV SOLN
1000.0000 mg | INTRAVENOUS | Status: AC
  Administered 2021-08-13: 1000 mg via INTRAVENOUS
  Filled 2021-08-13: qty 100

## 2021-08-13 MED ORDER — BUPIVACAINE HCL (PF) 0.5 % IJ SOLN
INTRAMUSCULAR | Status: DC | PRN
  Administered 2021-08-13: 15 mL via PERINEURAL

## 2021-08-13 MED ORDER — ACETAMINOPHEN 500 MG PO TABS
1000.0000 mg | ORAL_TABLET | Freq: Once | ORAL | Status: AC
  Administered 2021-08-13: 1000 mg via ORAL
  Filled 2021-08-13: qty 2

## 2021-08-13 MED ORDER — ONDANSETRON HCL 4 MG/2ML IJ SOLN
INTRAMUSCULAR | Status: AC
  Filled 2021-08-13: qty 2

## 2021-08-13 MED ORDER — ONDANSETRON HCL 4 MG PO TABS: 4.0000 mg | ORAL_TABLET | Freq: Four times a day (QID) | ORAL | Status: DC | PRN

## 2021-08-13 MED ORDER — MENTHOL 3 MG MT LOZG: 1.0000 | LOZENGE | OROMUCOSAL | Status: DC | PRN

## 2021-08-13 MED ORDER — LEVOTHYROXINE SODIUM 75 MCG PO TABS
75.0000 ug | ORAL_TABLET | Freq: Every day | ORAL | Status: DC
  Administered 2021-08-14: 75 ug via ORAL
  Filled 2021-08-13: qty 1

## 2021-08-13 MED ORDER — FENTANYL CITRATE (PF) 100 MCG/2ML IJ SOLN
INTRAMUSCULAR | Status: DC | PRN
  Administered 2021-08-13 (×2): 50 ug via INTRAVENOUS

## 2021-08-13 MED ORDER — CEFAZOLIN SODIUM-DEXTROSE 2-4 GM/100ML-% IV SOLN
2.0000 g | INTRAVENOUS | Status: AC
  Administered 2021-08-13: 2 g via INTRAVENOUS
  Filled 2021-08-13: qty 100

## 2021-08-13 MED ORDER — DOCUSATE SODIUM 100 MG PO CAPS
100.0000 mg | ORAL_CAPSULE | Freq: Two times a day (BID) | ORAL | Status: DC
  Administered 2021-08-13 – 2021-08-14 (×2): 100 mg via ORAL
  Filled 2021-08-13 (×2): qty 1

## 2021-08-13 MED ORDER — FOLIC ACID 1 MG PO TABS
1.0000 mg | ORAL_TABLET | Freq: Every day | ORAL | Status: DC
  Administered 2021-08-13 – 2021-08-14 (×2): 1 mg via ORAL
  Filled 2021-08-13 (×2): qty 1

## 2021-08-13 MED ORDER — ACETAMINOPHEN 500 MG PO TABS
500.0000 mg | ORAL_TABLET | Freq: Four times a day (QID) | ORAL | Status: DC
  Administered 2021-08-13 – 2021-08-14 (×3): 500 mg via ORAL
  Filled 2021-08-13 (×3): qty 1

## 2021-08-13 MED ORDER — ENOXAPARIN SODIUM 30 MG/0.3ML IJ SOSY
30.0000 mg | PREFILLED_SYRINGE | INTRAMUSCULAR | Status: DC
  Administered 2021-08-14: 30 mg via SUBCUTANEOUS
  Filled 2021-08-13: qty 0.3

## 2021-08-13 MED ORDER — ONDANSETRON HCL 4 MG/2ML IJ SOLN: 4.0000 mg | Freq: Four times a day (QID) | INTRAMUSCULAR | Status: DC | PRN

## 2021-08-13 MED ORDER — LISINOPRIL 5 MG PO TABS
5.0000 mg | ORAL_TABLET | Freq: Every day | ORAL | Status: DC
  Administered 2021-08-14: 5 mg via ORAL
  Filled 2021-08-13: qty 1

## 2021-08-13 MED ORDER — GABAPENTIN 300 MG PO CAPS: 600.0000 mg | ORAL_CAPSULE | Freq: Three times a day (TID) | ORAL | Status: DC

## 2021-08-13 MED ORDER — HYDROCODONE-ACETAMINOPHEN 7.5-325 MG PO TABS: 1.0000 | ORAL_TABLET | ORAL | Status: DC | PRN

## 2021-08-13 MED ORDER — HYDROCODONE-ACETAMINOPHEN 5-325 MG PO TABS
1.0000 | ORAL_TABLET | ORAL | Status: DC | PRN
  Administered 2021-08-14: 1 via ORAL
  Filled 2021-08-13: qty 1

## 2021-08-13 MED ORDER — HYDROXYCHLOROQUINE SULFATE 200 MG PO TABS
200.0000 mg | ORAL_TABLET | Freq: Every day | ORAL | Status: DC
  Administered 2021-08-13 – 2021-08-14 (×2): 200 mg via ORAL
  Filled 2021-08-13 (×2): qty 1

## 2021-08-13 MED ORDER — TRANEXAMIC ACID-NACL 1000-0.7 MG/100ML-% IV SOLN: 1000.0000 mg | Freq: Once | INTRAVENOUS | Status: DC

## 2021-08-13 MED ORDER — ACETAMINOPHEN 325 MG PO TABS: 325.0000 mg | ORAL_TABLET | Freq: Four times a day (QID) | ORAL | Status: DC | PRN

## 2021-08-13 MED ORDER — DEXAMETHASONE SODIUM PHOSPHATE 10 MG/ML IJ SOLN
INTRAMUSCULAR | Status: AC
  Filled 2021-08-13: qty 1

## 2021-08-13 MED ORDER — PREDNISONE 5 MG PO TABS
5.0000 mg | ORAL_TABLET | Freq: Every day | ORAL | Status: DC
  Administered 2021-08-14: 5 mg via ORAL
  Filled 2021-08-13: qty 1

## 2021-08-13 MED ORDER — VANCOMYCIN HCL 1 G IV SOLR
INTRAVENOUS | Status: DC | PRN
  Administered 2021-08-13: 1000 mg via TOPICAL

## 2021-08-13 MED ORDER — PANTOPRAZOLE SODIUM 40 MG PO TBEC
40.0000 mg | DELAYED_RELEASE_TABLET | Freq: Every day | ORAL | Status: DC
  Administered 2021-08-13 – 2021-08-14 (×2): 40 mg via ORAL
  Filled 2021-08-13 (×2): qty 1

## 2021-08-13 MED ORDER — PROPOFOL 10 MG/ML IV BOLUS
INTRAVENOUS | Status: DC | PRN
  Administered 2021-08-13: 100 mg via INTRAVENOUS

## 2021-08-13 MED ORDER — METOCLOPRAMIDE HCL 5 MG PO TABS: 5.0000 mg | ORAL_TABLET | Freq: Three times a day (TID) | ORAL | Status: DC | PRN

## 2021-08-13 MED ORDER — CHLORHEXIDINE GLUCONATE 0.12 % MT SOLN
OROMUCOSAL | Status: AC
  Administered 2021-08-13: 15 mL via OROMUCOSAL
  Filled 2021-08-13: qty 15

## 2021-08-13 MED ORDER — MORPHINE SULFATE (PF) 2 MG/ML IV SOLN: 0.5000 mg | INTRAVENOUS | Status: DC | PRN

## 2021-08-13 MED ORDER — MIDAZOLAM HCL 2 MG/2ML IJ SOLN
INTRAMUSCULAR | Status: AC
  Filled 2021-08-13: qty 2

## 2021-08-13 MED ORDER — DULOXETINE HCL 30 MG PO CPEP
30.0000 mg | ORAL_CAPSULE | Freq: Every day | ORAL | Status: DC
  Administered 2021-08-13 – 2021-08-14 (×2): 30 mg via ORAL
  Filled 2021-08-13 (×2): qty 1

## 2021-08-13 MED ORDER — FOLIC ACID 800 MCG PO TABS: 800.0000 ug | ORAL_TABLET | Freq: Every day | ORAL | Status: DC

## 2021-08-13 MED ORDER — GABAPENTIN 100 MG PO CAPS
100.0000 mg | ORAL_CAPSULE | Freq: Three times a day (TID) | ORAL | Status: DC
  Administered 2021-08-13 – 2021-08-14 (×2): 100 mg via ORAL
  Filled 2021-08-13 (×2): qty 1

## 2021-08-13 MED ORDER — FENTANYL CITRATE (PF) 100 MCG/2ML IJ SOLN
25.0000 ug | INTRAMUSCULAR | Status: DC | PRN
  Administered 2021-08-13: 25 ug via INTRAVENOUS

## 2021-08-13 MED ORDER — MIDAZOLAM HCL 5 MG/5ML IJ SOLN
INTRAMUSCULAR | Status: DC | PRN
  Administered 2021-08-13: 1 mg via INTRAVENOUS

## 2021-08-13 MED ORDER — PROPOFOL 10 MG/ML IV BOLUS
INTRAVENOUS | Status: AC
  Filled 2021-08-13: qty 20

## 2021-08-13 MED ORDER — EPHEDRINE SULFATE 50 MG/ML IJ SOLN
INTRAMUSCULAR | Status: DC | PRN
  Administered 2021-08-13 (×2): 5 mg via INTRAVENOUS

## 2021-08-13 MED ORDER — SUGAMMADEX SODIUM 500 MG/5ML IV SOLN
INTRAVENOUS | Status: DC | PRN
  Administered 2021-08-13: 200 mg via INTRAVENOUS

## 2021-08-13 MED ORDER — ONDANSETRON HCL 4 MG/2ML IJ SOLN
INTRAMUSCULAR | Status: DC | PRN
  Administered 2021-08-13: 4 mg via INTRAVENOUS

## 2021-08-13 MED ORDER — ROCURONIUM BROMIDE 100 MG/10ML IV SOLN
INTRAVENOUS | Status: DC | PRN
  Administered 2021-08-13: 70 mg via INTRAVENOUS

## 2021-08-13 MED ORDER — CHLORHEXIDINE GLUCONATE 0.12 % MT SOLN: 15.0000 mL | Freq: Once | OROMUCOSAL | Status: AC

## 2021-08-13 MED ORDER — MAGNESIUM 250 MG PO TABS: 250.0000 mg | ORAL_TABLET | Freq: Every day | ORAL | Status: DC

## 2021-08-13 MED ORDER — ENOXAPARIN SODIUM 40 MG/0.4ML IJ SOSY: 40.0000 mg | PREFILLED_SYRINGE | INTRAMUSCULAR | Status: DC

## 2021-08-13 MED ORDER — PHENOL 1.4 % MT LIQD: 1.0000 | OROMUCOSAL | Status: DC | PRN

## 2021-08-13 MED ORDER — METOCLOPRAMIDE HCL 5 MG/ML IJ SOLN: 5.0000 mg | Freq: Three times a day (TID) | INTRAMUSCULAR | Status: DC | PRN

## 2021-08-13 MED ORDER — DEXAMETHASONE SODIUM PHOSPHATE 10 MG/ML IJ SOLN
INTRAMUSCULAR | Status: DC | PRN
  Administered 2021-08-13: 6 mg via INTRAVENOUS

## 2021-08-13 MED ORDER — FENTANYL CITRATE PF 50 MCG/ML IJ SOSY
50.0000 ug | PREFILLED_SYRINGE | Freq: Once | INTRAMUSCULAR | Status: AC
  Administered 2021-08-13: 50 ug via INTRAVENOUS
  Filled 2021-08-13: qty 1

## 2021-08-13 MED ORDER — VANCOMYCIN HCL 1000 MG IV SOLR
INTRAVENOUS | Status: AC
  Filled 2021-08-13: qty 20

## 2021-08-13 MED ORDER — ORAL CARE MOUTH RINSE: 15.0000 mL | Freq: Once | OROMUCOSAL | Status: AC

## 2021-08-13 SURGICAL SUPPLY — 72 items
BAG COUNTER SPONGE SURGICOUNT (BAG) ×2 IMPLANT
BASEPLATE AUG FULL 24X20 +2 (Plate) ×2 IMPLANT
BASEPLATE MOD POST AUGM MGS 20 (Plate) ×2 IMPLANT
BIT DRILL 5/64X5 DISP (BIT) IMPLANT
BIT DRILL FLUTED 3.0 STRL (BIT) ×2 IMPLANT
BLADE SAG 18X100X1.27 (BLADE) ×2 IMPLANT
CALIBRATOR GLENOID VIP 5-D (SYSTAGENIX WOUND MANAGEMENT) ×2 IMPLANT
COVER SURGICAL LIGHT HANDLE (MISCELLANEOUS) ×2 IMPLANT
CUP SUT UNIV REVERS 36+2 LEFT (Cup) ×2 IMPLANT
DRAPE IMP U-DRAPE 54X76 (DRAPES) ×4 IMPLANT
DRAPE INCISE IOBAN 66X45 STRL (DRAPES) ×2 IMPLANT
DRAPE ORTHO SPLIT 77X108 STRL (DRAPES) ×4
DRAPE SURG 17X23 STRL (DRAPES) ×2 IMPLANT
DRAPE SURG ORHT 6 SPLT 77X108 (DRAPES) ×2 IMPLANT
DRAPE U-SHAPE 47X51 STRL (DRAPES) ×2 IMPLANT
DRESSING AQUACEL AG SP 3.5X10 (GAUZE/BANDAGES/DRESSINGS) ×1 IMPLANT
DRESSING AQUACEL AG SP 3.5X6 (GAUZE/BANDAGES/DRESSINGS) IMPLANT
DRSG AQUACEL AG ADV 3.5X10 (GAUZE/BANDAGES/DRESSINGS) IMPLANT
DRSG AQUACEL AG SP 3.5X10 (GAUZE/BANDAGES/DRESSINGS) ×2
DRSG AQUACEL AG SP 3.5X6 (GAUZE/BANDAGES/DRESSINGS)
DURAPREP 26ML APPLICATOR (WOUND CARE) ×2 IMPLANT
ELECT BLADE 4.0 EZ CLEAN MEGAD (MISCELLANEOUS) ×2
ELECT REM PT RETURN 9FT ADLT (ELECTROSURGICAL) ×2
ELECTRODE BLDE 4.0 EZ CLN MEGD (MISCELLANEOUS) ×1 IMPLANT
ELECTRODE REM PT RTRN 9FT ADLT (ELECTROSURGICAL) ×1 IMPLANT
FACESHIELD WRAPAROUND (MASK) ×2 IMPLANT
GLENOSPHERE 36 +4 LAT/24 (Joint) ×2 IMPLANT
GLOVE SRG 8 PF TXTR STRL LF DI (GLOVE) ×2 IMPLANT
GLOVE SURG ENC MOIS LTX SZ7.5 (GLOVE) ×4 IMPLANT
GLOVE SURG UNDER POLY LF SZ8 (GLOVE) ×4
GOWN STRL REUS W/ TWL LRG LVL3 (GOWN DISPOSABLE) ×1 IMPLANT
GOWN STRL REUS W/ TWL XL LVL3 (GOWN DISPOSABLE) ×2 IMPLANT
GOWN STRL REUS W/TWL LRG LVL3 (GOWN DISPOSABLE) ×2
GOWN STRL REUS W/TWL XL LVL3 (GOWN DISPOSABLE) ×4
INSERT HUMERAL 36 +6 (Shoulder) ×2 IMPLANT
KIT BASIN OR (CUSTOM PROCEDURE TRAY) ×2 IMPLANT
KIT TURNOVER KIT B (KITS) ×2 IMPLANT
LINER HUMERAL 36 +3MM SM (Shoulder) ×2 IMPLANT
MANIFOLD NEPTUNE II (INSTRUMENTS) ×2 IMPLANT
NEEDLE 1/2 CIR MAYO (NEEDLE) ×2 IMPLANT
NEEDLE HYPO 25GX1X1/2 BEV (NEEDLE) ×2 IMPLANT
NS IRRIG 1000ML POUR BTL (IV SOLUTION) ×2 IMPLANT
PACK SHOULDER (CUSTOM PROCEDURE TRAY) ×2 IMPLANT
PAD ARMBOARD 7.5X6 YLW CONV (MISCELLANEOUS) ×4 IMPLANT
PIN NITINOL TARGETER 2.8 (PIN) ×2 IMPLANT
REAMER ANGLED HEAD SMALL (DRILL) ×2 IMPLANT
RESTRAINT HEAD UNIVERSAL NS (MISCELLANEOUS) ×2 IMPLANT
SCREW PERI LOCK 5.5X16 (Screw) ×4 IMPLANT
SCREW PERI LOCK 5.5X24 (Screw) ×2 IMPLANT
SCREW PERI LOCK 5.5X32 (Screw) ×2 IMPLANT
SLING ARM IMMOBILIZER LRG (SOFTGOODS) IMPLANT
SLING ARM IMMOBILIZER MED (SOFTGOODS) ×4 IMPLANT
SPACER SHLD UNI REV 36 +6 (Shoulder) ×2 IMPLANT
SPONGE T-LAP 18X18 ~~LOC~~+RFID (SPONGE) ×4 IMPLANT
SPONGE T-LAP 4X18 ~~LOC~~+RFID (SPONGE) ×2 IMPLANT
STEM HUMERAL UNIVER REV SIZE 7 (Stem) ×2 IMPLANT
STRIP CLOSURE SKIN 1/2X4 (GAUZE/BANDAGES/DRESSINGS) ×2 IMPLANT
SUCTION FRAZIER HANDLE 10FR (MISCELLANEOUS) ×2
SUCTION TUBE FRAZIER 10FR DISP (MISCELLANEOUS) ×1 IMPLANT
SUT FIBERWIRE #2 38 T-5 BLUE (SUTURE) ×2
SUT MNCRL AB 3-0 PS2 18 (SUTURE) ×2 IMPLANT
SUT VIC AB 1 CT1 27 (SUTURE)
SUT VIC AB 1 CT1 27XBRD ANBCTR (SUTURE) IMPLANT
SUT VIC AB 2-0 CT1 27 (SUTURE) ×2
SUT VIC AB 2-0 CT1 TAPERPNT 27 (SUTURE) ×1 IMPLANT
SUTURE FIBERWR #2 38 T-5 BLUE (SUTURE) ×1 IMPLANT
SYR CONTROL 10ML LL (SYRINGE) ×2 IMPLANT
TOWEL GREEN STERILE (TOWEL DISPOSABLE) ×2 IMPLANT
TOWEL GREEN STERILE FF (TOWEL DISPOSABLE) ×2 IMPLANT
TOWER CARTRIDGE SMART MIX (DISPOSABLE) IMPLANT
WATER STERILE IRR 1000ML POUR (IV SOLUTION) ×2 IMPLANT
YANKAUER SUCT BULB TIP NO VENT (SUCTIONS) ×2 IMPLANT

## 2021-08-13 NOTE — Discharge Instructions (Signed)
Orthopedic surgery discharge instructions:  -Maintain postoperative bandage until follow-up appointment.  This is waterproof, and you may begin showering on postoperative day #3.  Do not submerge underwater.  Maintain that bandage until your follow-up appointment in 2 weeks.  -No lifting over 2 pounds with operateive arm.  You may use the arm immediately for activities of daily living such as bathing, washing your face and brushing your teeth, eating, and getting dressed.  Otherwise maintain your sling when you are out of the house and sleeping.  -Apply ice liberally to the shoulder throughout the day.  For mild to moderate pain use Tylenol and Advil as needed around-the-clock.  For breakthrough pain use oxycodone as necessary.  -You will return to see Dr. Shiraz Bastyr in the office in 2 weeks for routine postoperative check with x-rays.  

## 2021-08-13 NOTE — Anesthesia Procedure Notes (Signed)
Anesthesia Regional Block: Interscalene brachial plexus block   Pre-Anesthetic Checklist: , timeout performed,  Correct Patient, Correct Site, Correct Laterality,  Correct Procedure, Correct Position, site marked,  Risks and benefits discussed,  Surgical consent,  Pre-op evaluation,  At surgeon's request and post-op pain management  Laterality: Left  Prep: Maximum Sterile Barrier Precautions used, chloraprep       Needles:  Injection technique: Single-shot  Needle Type: Echogenic Stimulator Needle     Needle Length: 4cm  Needle Gauge: 22     Additional Needles:   Procedures:,,,, ultrasound used (permanent image in chart),,    Narrative:  Start time: 08/13/2021 12:02 PM End time: 08/13/2021 12:12 PM Injection made incrementally with aspirations every 5 mL.  Performed by: Personally  Anesthesiologist: Freddrick March, MD  Additional Notes: Monitors applied. No increased pain on injection. No increased resistance to injection. Injection made in 5cc increments. Good needle visualization. Patient tolerated procedure well.

## 2021-08-13 NOTE — Brief Op Note (Signed)
08/13/2021  2:40 PM  PATIENT:  Teresa Kidd  80 y.o. female  PRE-OPERATIVE DIAGNOSIS:  Left shoulder osteoarthritis  POST-OPERATIVE DIAGNOSIS:  Left shoulder osteoarthritis  PROCEDURE:  Procedure(s): REVERSE SHOULDER ARTHROPLASTY (Left)  SURGEON:  Surgeon(s) and Role:    * Nicholes Stairs, MD - Primary  PHYSICIAN ASSISTANT: Jonelle Sidle, PA-C  ANESTHESIA:   regional and general  EBL:  100 cc  BLOOD ADMINISTERED:none  DRAINS: none   LOCAL MEDICATIONS USED:  NONE  SPECIMEN:  No Specimen  DISPOSITION OF SPECIMEN:  N/A  COUNTS:  YES  TOURNIQUET:  * No tourniquets in log *  DICTATION: .Note written in EPIC  PLAN OF CARE: Admit for overnight observation  PATIENT DISPOSITION:  PACU - hemodynamically stable.   Delay start of Pharmacological VTE agent (>24hrs) due to surgical blood loss or risk of bleeding: not applicable

## 2021-08-13 NOTE — Op Note (Signed)
08/13/2021  2:49 PM  PATIENT:  Teresa Kidd    PRE-OPERATIVE DIAGNOSIS:  Left shoulder osteoarthritis  POST-OPERATIVE DIAGNOSIS:  Same  PROCEDURE:  Left REVERSE SHOULDER ARTHROPLASTY  SURGEON:  Nicholes Stairs, MD  ASSISTANT: Jonelle Sidle, PA-C  Attestation:  PA Mcclung present for the entire procedure.  Participated in all critical portions.  ANESTHESIA:   General  ESTIMATED BLOOD LOSS: 100 cc  PREOPERATIVE INDICATIONS:  Teresa Kidd is a  80 y.o. female with a diagnosis of Left shoulder osteoarthritis who failed conservative measures and elected for surgical management.    The risks benefits and alternatives were discussed with the patient preoperatively including but not limited to the risks of infection, bleeding, nerve injury, cardiopulmonary complications, the need for revision surgery, dislocation, brachial plexus palsy, incomplete relief of pain, among others, and the patient was willing to proceed.  OPERATIVE IMPLANTS:  Arthrex reverse arthroplasty with 20 degree full augment glenoid with +2 mm of lateralization.  20 mm central post. 28 mm inferior locking screw on the glenoid baseplate, 32 mm superior, 57mm anterior and posterior locking screws 36 mm +4 mm lateralized glenosphere Size 7 humeral stem with a +6 humeral tray and a +3 mm polyethylene liner  OPERATIVE FINDINGS:  Advanced glenohumeral osteoarthritis with complete tear of the supraspinatus to the level of the glenoid with maceration and flattening of the intra-articular portion of the long head of the biceps tendon.  Centralized/concentric wear pattern of the glenoid.  Full-thickness chondral loss on the superior posterior aspect of the humeral head with intact subscapularis as well as teres minor and posterior half of infraspinatus.  OPERATIVE PROCEDURE: The patient was brought to the operating room and placed in the supine position. General anesthesia was administered. IV antibiotics were given. A Foley  was placed. Time out was performed. The upper extremity was prepped and draped in usual sterile fashion. The patient was in a beachchair position. Deltopectoral approach was carried out. The biceps was tenotomized.  The subscapularis was released off of the bone.   I then performed circumferential releases of the humerus, and then dislocated the head, and then reamed with the reamer to the above named size.  I then applied the jig, and cut the humeral head in 30 of retroversion, and then turned my attention to the glenoid.  Deep retractors were placed, and I resected the labrum, and then placed a guidepin into the center position on the glenoid, with slight inferior inclination.  Guidepin position was placed based on preoperative CT scan and patient specific drill guide.  I then reamed over the guidepin, and this created a small metaphyseal cancellus blush inferiorly, removing just the cartilage to the subchondral bone superiorly. The base plate was selected and impacted place, with the 20 degree full augment oriented at approximately 1:30 position, and then I secured it with 4 peripheral locking screws I then turned my attention to the glenosphere, and impacted this into place, and then placing the central set screw.   The glenoid sphere was completely seated, and had engagement of the [facility] taper. I then turned my attention back to the humerus.  I sequentially broached, and then trialed, and was found to restore soft tissue tension, and it had 2 finger tightness. Therefore the above named components were selected. The shoulder felt stable throughout functional motion.  I then impacted the real prosthesis into place, as well as the real humeral tray, and reduced the shoulder. The shoulder had excellent motion, and was stable, and I  irrigated the wounds copiously.   The suture cup eyelets were utilized to place 2 horizontal mattress sutures with suture tape into the subscapularis tendon and bring  this back down to the lesser tuberosity.  I then irrigated the shoulder copiously once more, repaired the deltopectoral interval with #2 FiberWire , with 1 g of vancomycin powder placed in the deltopectoral interval followed by subcutaneous Vicryl, then monocryl for the skin,  with Steri-Strips and sterile gauze for the skin. The patient was awakened and returned back in stable and satisfactory condition. There no complications and they tolerated the procedure well.  All counts were correct x2.   Disposition:  Teresa Kidd will be nonweightbearing to the left upper extremity.  She was placed in a sling.  She will be admitted to the orthopedics floor for routine postoperative care.  She will work with occupational therapy on donning and doffing her sling.  We will monitor her vital signs and pain management in the morning and likely discharge home tomorrow.

## 2021-08-13 NOTE — H&P (Signed)
ORTHOPAEDIC H and P  REQUESTING PHYSICIAN: Nicholes Stairs, MD  PCP:  Pcp, No  Chief Complaint: Left shoulder pain  HPI: Teresa Kidd is a 80 y.o. female who complains of worsening left shoulder pain over the years.  She has had multiple years of treatment.  She is here today for reverse arthroplasty.  We have previously discussed the surgery in the office.  No new complaints today.  Past Medical History:  Diagnosis Date   Anemia    Arthritis    Cancer ([place]) 2014   Breast CA Left    Dehydration    with AKI   DVT (deep venous thrombosis) ([place]) 01/18/2016   left leg   Family history of adverse reaction to anesthesia    GERD (gastroesophageal reflux disease)    History of kidney stones 2006-2007   Hypertension    Hypothyroidism    PONV (postoperative nausea and vomiting)    motion sickness   Past Surgical History:  Procedure Laterality Date   APPENDECTOMY     BACK SURGERY  1994   herniated disc, DJD , Stabalizers , Spinal stenosis surgery   BREAST SURGERY     lumpectomy left breast   CARPAL TUNNEL RELEASE  1985   CHOLECYSTECTOMY  1976   COLONOSCOPY     polyps removed   CYSTOCELE REPAIR N/A 08/22/2017   Procedure: ANTERIOR (CYSTOCELE) WITH GRAFT;  Surgeon: Bjorn Loser, MD;  Location: [facility];  Service: Urology;  Laterality: N/A;   CYSTOSCOPY N/A 08/22/2017   Procedure: CYSTOSCOPY;  Surgeon: Bjorn Loser, MD;  Location: [facility];  Service: Urology;  Laterality: N/A;   DILATATION & CURETTAGE/HYSTEROSCOPY WITH MYOSURE N/A 10/24/2016   Procedure: DILATATION & CURETTAGE/HYSTEROSCOPY WITH MYOSURE;  Surgeon: Servando Salina, MD;  Location: [facility];  Service: Gynecology;  Laterality: N/A;   ROTATOR CUFF REPAIR     SALPINGOOPHORECTOMY Bilateral 08/22/2017   Procedure: BILATERAL SALPINGO OOPHORECTOMY;  Surgeon: Servando Salina, MD;  Location: [facility];  Service: Gynecology;  Laterality: Bilateral;   THYROIDECTOMY  1985   TOTAL HIP ARTHROPLASTY Right 02/23/2021    Procedure: TOTAL HIP ARTHROPLASTY ANTERIOR APPROACH;  Surgeon: Paralee Cancel, MD;  Location: [facility];  Service: Orthopedics;  Laterality: Right;   VAGINAL HYSTERECTOMY N/A 08/22/2017   Procedure: TOTAL HYSTERECTOMY VAGINAL;  Surgeon: Servando Salina, MD;  Location: [facility];  Service: Gynecology;  Laterality: N/A;   VAGINAL PROLAPSE REPAIR N/A 08/22/2017   Procedure: VAGINAL VAULT PROLAPSE REPAIR;  Surgeon: Bjorn Loser, MD;  Location: [facility];  Service: Urology;  Laterality: N/A;   Social History   Socioeconomic History   Marital status: Widowed    Spouse name: Not on file   Number of children: Not on file   Years of education: Not on file   Highest education level: Not on file  Occupational History   Not on file  Tobacco Use   Smoking status: Never   Smokeless tobacco: Never  Vaping Use   Vaping Use: Never used  Substance and Sexual Activity   Alcohol use: No   Drug use: No   Sexual activity: Not Currently  Other Topics Concern   Not on file  Social History Narrative   Not on file   Social Determinants of Health   Financial Resource Strain: Not on file  Food Insecurity: Not on file  Transportation Needs: Not on file  Physical Activity: Not on file  Stress: Not on file  Social Connections: Not on file   History reviewed. No pertinent family history.  Allergies  Allergen Reactions   Feldene [Piroxicam] Itching    In palm of hands & bottom of feets   Prior to Admission medications   Medication Sig Start Date End Date Taking? Authorizing Provider  DULoxetine (CYMBALTA) 30 MG capsule Take 30 mg by mouth daily.   Yes [provider]  gabapentin (NEURONTIN) 300 MG capsule Take 600 mg by mouth 3 (three) times daily.   Yes [provider]  hydroxychloroquine (PLAQUENIL) 200 MG tablet Take 200 mg by mouth daily.   Yes [provider]  levothyroxine (SYNTHROID, LEVOTHROID) 75 MCG tablet Take 75 mcg by mouth daily before breakfast. 09/15/16   Yes [provider]  lisinopril (PRINIVIL,ZESTRIL) 5 MG tablet Take 5 mg by mouth daily. 08/02/16  Yes [provider]  omeprazole (PRILOSEC) 20 MG capsule Take 20 mg by mouth daily. 09/21/16  Yes [provider]  predniSONE (DELTASONE) 5 MG tablet Take 5 mg by mouth daily with breakfast.   Yes [provider]  simvastatin (ZOCOR) 40 MG tablet Take 40 mg by mouth daily.   Yes [provider]  ADVANCED FIBER COMPLEX PO Take 3 tablets by mouth daily. Patient not taking: No sig reported    [provider]  Calcium Carb-Cholecalciferol (CALCIUM 600+D) 600-800 MG-UNIT TABS Take 1 tablet by mouth daily. Patient not taking: No sig reported    [provider]  Cholecalciferol (VITAMIN D3) 1000 units CAPS Take 1,000 Units by mouth daily. Patient not taking: No sig reported    [provider]  Cyanocobalamin (B-12) 5000 MCG CAPS Take 5,000 mcg by mouth daily. Patient not taking: No sig reported    [provider]  denosumab (PROLIA) 60 MG/ML SOLN injection Inject 60 mg into the skin every 6 (six) months. Administer in upper arm, thigh, or abdomen  Next dose due 11/2017    [provider]  folic acid (FOLVITE) 400 MCG tablet Take 800 mcg by mouth daily. Patient not taking: No sig reported    [provider]  Magnesium 250 MG TABS Take 250 mg by mouth daily. Patient not taking: No sig reported    [provider]  methocarbamol (ROBAXIN) 500 MG tablet Take 1 tablet (500 mg total) by mouth every 6 (six) hours as needed for muscle spasms. Patient not taking: No sig reported 02/25/21   Irving Copas, PA-C  Multiple Vitamins-Minerals (CENTRUM SILVER 50+WOMEN PO) Take 1 tablet by mouth daily. Patient not taking: No sig reported    [provider]  Omega-3 Fatty Acids (DIALYVITE OMEGA-3 CONCENTRATE) 600 MG CAPS Take 600 mg by mouth in the morning and at bedtime. Patient not taking: No sig  reported    [provider]   No results found.  Positive ROS: All other systems have been reviewed and were otherwise negative with the exception of those mentioned in the HPI and as above.  Physical Exam: General: Alert, no acute distress Cardiovascular: No pedal edema Respiratory: No cyanosis, no use of accessory musculature GI: No organomegaly, abdomen is soft and non-tender Skin: No lesions in the area of chief complaint Neurologic: Sensation intact distally Psychiatric: Patient is competent for consent with normal mood and affect Lymphatic: No axillary or cervical lymphadenopathy  MUSCULOSKELETAL:  Left upper extremity and shoulder is warm and well-perfused with no open wounds or lesions.  Assessment: Left shoulder advanced rotator cuff arthropathy  Plan: -Plan to proceed today with reverse arthroplasty of the left shoulder.  We discussed the risk of bleeding,  infection, damage to surrounding nerves and vessels, dislocation, fracture, need for revision surgery, risk of anesthesia.  She has provided informed consent.  -Plan for admission postoperatively for close observation and pain control.    Nicholes Stairs, MD Cell [phone]    08/13/2021 12:30 PM

## 2021-08-13 NOTE — Plan of Care (Signed)
  Problem: Pain Managment: Goal: General experience of comfort will improve Outcome: Progressing   

## 2021-08-13 NOTE — Transfer of Care (Signed)
Immediate Anesthesia Transfer of Care Note  Patient: Teresa Kidd  Procedure(s) Performed: REVERSE SHOULDER ARTHROPLASTY (Left: Shoulder)  Patient Location: PACU  Anesthesia Type:General  Level of Consciousness: awake, alert , oriented and patient cooperative  Airway & Oxygen Therapy: Patient Spontanous Breathing, Patient connected to nasal cannula oxygen and Patient connected to face mask  Post-op Assessment: Report given to RN, Post -op Vital signs reviewed and stable and Patient moving all extremities X 4  Post vital signs: Reviewed and stable  Last Vitals:  Vitals Value Taken Time  BP 166/97 08/13/21 1510  Temp    Pulse 67 08/13/21 1513  Resp 15 08/13/21 1513  SpO2 100 % 08/13/21 1513  Vitals shown include unvalidated device data.  Last Pain:  Vitals:   08/13/21 1009  TempSrc:   PainSc: 0-No pain         Complications: No notable events documented.

## 2021-08-13 NOTE — Anesthesia Preprocedure Evaluation (Addendum)
Anesthesia Evaluation  Patient identified by MRN, date of birth, ID band Patient awake    Reviewed: Allergy & Precautions, NPO status , Patient's Chart, lab work & pertinent test results  History of Anesthesia Complications (+) PONV  Airway Mallampati: I  TM Distance: >3 FB Neck ROM: Full    Dental  (+) Teeth Intact, Dental Advisory Given   Pulmonary neg pulmonary ROS,    Pulmonary exam normal breath sounds clear to auscultation       Cardiovascular hypertension, Pt. on medications + DVT  Normal cardiovascular exam Rhythm:Regular Rate:Normal     Neuro/Psych negative neurological ROS  negative psych ROS   GI/Hepatic Neg liver ROS, GERD  Controlled and Medicated,  Endo/Other  Hypothyroidism   Renal/GU negative Renal ROS  negative genitourinary   Musculoskeletal negative musculoskeletal ROS (+)   Abdominal   Peds  Hematology negative hematology ROS (+)   Anesthesia Other Findings   Reproductive/Obstetrics                            Anesthesia Physical Anesthesia Plan  ASA: 3  Anesthesia Plan: General and Regional   Post-op Pain Management:  Regional for Post-op pain   Induction: Intravenous  PONV Risk Score and Plan: 4 or greater and Midazolam, Dexamethasone, Ondansetron and Treatment may vary due to age or medical condition  Airway Management Planned: Oral ETT  Additional Equipment:   Intra-op Plan:   Post-operative Plan: Extubation in OR  Informed Consent: I have reviewed the patients History and Physical, chart, labs and discussed the procedure including the risks, benefits and alternatives for the proposed anesthesia with the patient or authorized representative who has indicated his/her understanding and acceptance.     Dental advisory given  Plan Discussed with: CRNA  Anesthesia Plan Comments:         Anesthesia Quick Evaluation

## 2021-08-13 NOTE — Anesthesia Procedure Notes (Signed)
Procedure Name: Intubation Date/Time: 08/13/2021 1:06 PM Performed by: Jonna Munro, CRNA Pre-anesthesia Checklist: Patient identified, Emergency Drugs available, Suction available, Patient being monitored and Timeout performed Patient Re-evaluated:Patient Re-evaluated prior to induction Oxygen Delivery Method: Circle system utilized Preoxygenation: Pre-oxygenation with 100% oxygen Induction Type: IV induction Ventilation: Mask ventilation without difficulty Laryngoscope Size: Mac and 3 Grade View: Grade I Tube type: Oral Tube size: 7.0 mm Number of attempts: 1 Airway Equipment and Method: Stylet Placement Confirmation: ETT inserted through vocal cords under direct vision, positive ETCO2 and breath sounds checked- equal and bilateral Secured at: 21 cm Tube secured with: Tape Dental Injury: Teeth and Oropharynx as per pre-operative assessment

## 2021-08-14 DIAGNOSIS — M19012 Primary osteoarthritis, left shoulder: Secondary | ICD-10-CM | POA: Diagnosis not present

## 2021-08-14 LAB — BASIC METABOLIC PANEL
Anion gap: 5 (ref 5–15)
BUN: 16 mg/dL (ref 8–23)
CO2: 24 mmol/L (ref 22–32)
Calcium: 7.9 mg/dL — ABNORMAL LOW (ref 8.9–10.3)
Chloride: 106 mmol/L (ref 98–111)
Creatinine, Ser: 1.25 mg/dL — ABNORMAL HIGH (ref 0.44–1.00)
GFR, Estimated: 44 mL/min — ABNORMAL LOW (ref >60)
Glucose, Bld: 175 mg/dL — ABNORMAL HIGH (ref 70–99)
Potassium: 4.5 mmol/L (ref 3.5–5.1)
Sodium: 135 mmol/L (ref 135–145)

## 2021-08-14 LAB — HEMOGLOBIN AND HEMATOCRIT, BLOOD
HCT: 28.2 % — ABNORMAL LOW (ref 36.0–46.0)
Hemoglobin: 8.9 g/dL — ABNORMAL LOW (ref 12.0–15.0)

## 2021-08-14 MED ORDER — HYDROCODONE-ACETAMINOPHEN 5-325 MG PO TABS: 1.0000 | ORAL_TABLET | ORAL | 0 refills | Status: DC | PRN

## 2021-08-14 MED ORDER — METHOCARBAMOL 500 MG PO TABS: 500.0000 mg | ORAL_TABLET | Freq: Four times a day (QID) | ORAL | 1 refills | Status: DC | PRN

## 2021-08-14 MED ORDER — ONDANSETRON 4 MG PO TBDP: 4.0000 mg | ORAL_TABLET | Freq: Three times a day (TID) | ORAL | 0 refills | Status: DC | PRN

## 2021-08-14 NOTE — Evaluation (Signed)
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 PMHx: anemia, arthitis, cancer, DVT, hypertenstion   Clinical Impression   Teresa Kidd required some assist from her full time caregiver prior to the above shoulder sx. She lives in a 1 level home with a ramped entrance. Pt tolerated all exercises well, and verbalized her caregiver will assist with them once d/c home. She required min guard for functional mobility and transfers with a SPC. Pt demonstrated good understanding of compensatory techniques for ADLs to manage LUE; she also verbalized understanding of all precautions. Pt would benefit from continued OT acutely should her length of stay continue, however pt has plans to d/c home this afternoon. Recommend shoulder rehab progress as the surgeons recommendation.      Recommendations for follow up therapy are one component of a multi-disciplinary discharge planning process, led by the attending physician.  Recommendations may be updated based on patient status, additional functional criteria and insurance authorization.   Follow Up Recommendations  Follow surgeon's recommendation for DC plan and follow-up therapies    Equipment Recommendations  None recommended by OT       Precautions / Restrictions Precautions Precautions: Shoulder;Fall Type of Shoulder Precautions: L reverse TSA Shoulder Interventions: Shoulder sling/immobilizer;At all times;Off for dressing/bathing/exercises Precaution Booklet Issued: Yes (comment) Required Braces or Orthoses: Sling Restrictions Weight Bearing Restrictions: Yes LUE Weight Bearing: Non weight bearing      Mobility Bed Mobility Overal bed mobility: Modified Independent             General bed mobility comments: HOB elevated     Transfers Overall transfer level: Needs assistance Equipment used: Straight cane Transfers: Sit to/from Stand Sit to Stand: Min guard         General transfer comment: Uses SPC at baseline    Balance Overall balance assessment: Needs assistance Sitting-balance support: Feet supported Sitting balance-Leahy Scale: Good     Standing balance support: Single extremity supported Standing balance-Leahy Scale: Fair                             ADL either performed or assessed with clinical judgement   ADL Overall ADL's : Needs assistance/impaired Eating/Feeding: Set up;Sitting   Grooming: Set up;Sitting   Upper Body Bathing: Moderate assistance;Cueing for compensatory techniques;Sitting Upper Body Bathing Details (indicate cue type and reason): educated on dangle method Lower Body Bathing: Moderate assistance;Sit to/from stand   Upper Body Dressing : Moderate assistance;Cueing for compensatory techniques;Sitting Upper Body Dressing Details (indicate cue type and reason): educated on L arm in first, dangle method, caregiver assistance Lower Body Dressing: Moderate assistance;Sit to/from stand   Toilet Transfer: Min guard;Ambulation (SPC)   Toileting- Clothing Manipulation and Hygiene: Min guard;Sitting/lateral lean       Functional mobility during ADLs: Min guard;Cueing for safety;Cane       Vision Baseline Vision/History: 1 Wears glasses Ability to See in Adequate Light: 0 Adequate Patient Visual Report: No change from baseline Vision Assessment?: No apparent visual deficits     Perception     Praxis      Pertinent Vitals/Pain Pain Assessment: Faces Faces Pain Scale: Hurts a little bit Pain Location: R arm IV placement Pain Descriptors / Indicators: Discomfort Pain Intervention(s): Monitored during session (Made RN aware)  Hand Dominance Right   Extremity/Trunk Assessment Upper Extremity Assessment Upper Extremity Assessment: LUE  deficits/detail LUE Deficits / Details: s/p L reverse TSA LUE: Unable to fully assess due to immobilization LUE Sensation: decreased light touch LUE Coordination: decreased fine motor;decreased gross motor   Lower Extremity Assessment Lower Extremity Assessment: Generalized weakness   Cervical / Trunk Assessment Cervical / Trunk Assessment: Normal   Communication Communication Communication: No difficulties   Cognition Arousal/Alertness: Awake/alert Behavior During Therapy: WFL for tasks assessed/performed Overall Cognitive Status: Within Functional Limits for tasks assessed                                     General Comments  VSS on RA; pt's IV site red, swollen and painful, RN aware    Exercises Exercises: Shoulder Shoulder Exercises Elbow Flexion: AAROM;Left;5 reps;Seated Elbow Extension: AAROM;Left;5 reps;Seated Wrist Flexion: AAROM;Left;5 reps;Seated Wrist Extension: AAROM;Left;5 reps;Seated Digit Composite Flexion: AAROM;Left;Seated Composite Extension: AAROM;Left;5 reps;Seated   Shoulder Instructions Shoulder Instructions Donning/doffing shirt without moving shoulder: Moderate assistance Method for sponge bathing under operated UE: Moderate assistance Donning/doffing sling/immobilizer: Caregiver independent with task Correct positioning of sling/immobilizer: Caregiver independent with task ROM for elbow, wrist and digits of operated UE: Minimal assistance;Caregiver independent with task Sling wearing schedule (on at all times/off for ADL's): Independent Proper positioning of operated UE when showering: Caregiver independent with task Positioning of UE while sleeping: [place] expects to be discharged to:: Private residence Living Arrangements: Children;Other (Comment) (live in caregiver) Available Help at Discharge: Personal care attendant;Family Type of Home: Mobile home Home Access: Ramped entrance     Home  Layout: One level     Bathroom Shower/Tub: Walk-in shower;Tub only   Bathroom Toilet: Standard (BSC over toilet) Bathroom Accessibility: Yes How Accessible: Accessible via walker Home Equipment: [place] - 2 wheels;Cane - single point;Bedside commode;Wheelchair - Education officer, community - power;Shower seat   Additional Comments: pt's caregiver is with her 24/7, son lives with pt but has had a recent foot amputation      Prior Functioning/Environment Level of Independence: Needs assistance  Gait / Transfers Assistance Needed: SPC for mobility, RW for nighttime trips to the bathroom ADL's / Homemaking Assistance Needed: caregiver provides assistance for drying after bathing her self, dressing, cooking adn cleaning; pt does not drive Communication / Swallowing Assistance Needed: [facility]          OT Problem List: Decreased range of motion;Decreased strength;Decreased activity tolerance;Decreased safety awareness;Decreased knowledge of use of DME or AE;Decreased knowledge of precautions;Impaired UE functional use;Pain      OT Treatment/Interventions:      OT Goals(Current goals can be found in the care plan section) Acute Rehab OT Goals Patient Stated Goal: home today OT Goal Formulation: All assessment and education complete, DC therapy  OT Frequency:     Barriers to D/C:            Co-evaluation              AM-PAC OT "6 Clicks" Daily Activity     Outcome Measure Help from another person eating meals?: A Little Help from another person taking care of personal grooming?: A Little Help from another person toileting, which includes using toliet, bedpan, or urinal?: A Little Help from another person bathing (including washing, rinsing, drying)?: A Lot Help from another person to put on and taking off regular upper body clothing?: A Lot Help from  another person to put on and taking off regular lower body clothing?: A Lot 6 Click Score: 15   End of Session Equipment Utilized During  Treatment:  (sling, SPC) Nurse Communication: Mobility status (IV site integrity)  Activity Tolerance: Patient tolerated treatment well Patient left: in chair;with call bell/phone within reach  OT Visit Diagnosis: Muscle weakness (generalized) (M62.81);Pain                Time: 1117-3567 OT Time Calculation (min): 37 min Charges:  OT General Charges $OT Visit: 1 Visit OT Evaluation $OT Eval Moderate Complexity: 1 Mod   Orva Riles A Kaeleb Emond 08/14/2021, 9:04 AM

## 2021-08-14 NOTE — Plan of Care (Signed)
  Problem: Education: Goal: Knowledge of General Education information will improve Description Including pain rating scale, medication(s)/side effects and non-pharmacologic comfort measures Outcome: Progressing   

## 2021-08-14 NOTE — Progress Notes (Signed)
   Subjective:  Patient reports pain as mild.  Still some numbness in her hand.  She had a good night.  Doing well this am with no complaints.  Objective:   VITALS:   Vitals:   08/13/21 1715 08/13/21 2016 08/14/21 0006 08/14/21 0356  BP: 120/68 (!) 107/36 92/63 112/83  Pulse: 67 76 63 76  Resp: 18 17 17 17   Temp: 97.6 F (36.4 C) 98.2 F (36.8 C) (!) 97.5 F (36.4 C) 97.6 F (36.4 C)  TempSrc: Oral Oral Oral Oral  SpO2: 100% 97% 100% 98%  Weight:      Height:        Neurologically intact Sensation intact distally Intact pulses distally Incision: dressing C/D/I Compartment soft Still some decreased sensation with radial nerve, but motor working.  Otherwise intact  Lab Results  Component Value Date   WBC 16.9 (H) 08/13/2021   HGB 8.9 (L) 08/14/2021   HCT 28.2 (L) 08/14/2021   MCV 94.1 08/13/2021   PLT 261 08/13/2021   BMET    Component Value Date/Time   NA 135 08/14/2021 0030   K 4.5 08/14/2021 0030   CL 106 08/14/2021 0030   CO2 24 08/14/2021 0030   GLUCOSE 175 (H) 08/14/2021 0030   BUN 16 08/14/2021 0030   CREATININE 1.25 (H) 08/14/2021 0030   CALCIUM 7.9 (L) 08/14/2021 0030   GFRNONAA 44 (L) 08/14/2021 0030     Assessment/Plan: 1 Day Post-Op   Active Problems:   S/P reverse total shoulder arthroplasty, left   Up with therapy OT treating this am.  Ok to dc today  Sling unless doing exercise or ADLs  NWB to LUE  Return to see Stann Mainland in 2 weeks   Nicholes Stairs 08/14/2021, 8:40 AM   Geralynn Rile, MD [phone]

## 2021-08-15 NOTE — Discharge Summary (Signed)
Patient ID: Teresa Kidd MRN: [id number] DOB/AGE: Jun 12, 1941 80 y.o.  Admit date: 08/13/2021 Discharge date: 08/14/2021  Primary Diagnosis: Left rotator cuff arthropathy Admission Diagnoses:  Past Medical History:  Diagnosis Date   Anemia    Arthritis    Cancer ([facility]) 2014   Breast CA Left    Dehydration    with AKI   DVT (deep venous thrombosis) ([facility]) 01/18/2016   left leg   Family history of adverse reaction to anesthesia    GERD (gastroesophageal reflux disease)    History of kidney stones 2006-2007   Hypertension    Hypothyroidism    PONV (postoperative nausea and vomiting)    motion sickness   Discharge Diagnoses:   Active Problems:   S/P reverse total shoulder arthroplasty, left  Estimated body mass index is 26.17 kg/m as calculated from the following:   Height as of this encounter: 5' (1.524 m).   Weight as of this encounter: 60.8 kg.  Procedure:  Procedure(s) (LRB): REVERSE SHOULDER ARTHROPLASTY (Left)   Consults: None  HPI: Teresa Kidd admitted for replacement of left shoulder with reverse arthroplasty for end-stage rotator cuff arthropathy.  She had an uneventful surgical course and was admitted postoperatively for pain control and occupational therapy. Laboratory Data: Admission on 08/13/2021, Discharged on 08/14/2021  Component Date Value Ref Range Status   WBC 08/13/2021 16.9 (A) 4.0 - 10.5 K/uL Final   RBC 08/13/2021 3.90  3.87 - 5.11 MIL/uL Final   Hemoglobin 08/13/2021 11.3 (A) 12.0 - 15.0 g/dL Final   HCT 08/13/2021 36.7  36.0 - 46.0 % Final   MCV 08/13/2021 94.1  80.0 - 100.0 fL Final   MCH 08/13/2021 29.0  26.0 - 34.0 pg Final   MCHC 08/13/2021 30.8  30.0 - 36.0 g/dL Final   RDW 08/13/2021 16.4 (A) 11.5 - 15.5 % Final   Platelets 08/13/2021 261  150 - 400 K/uL Final   nRBC 08/13/2021 0.0  0.0 - 0.2 % Final   Performed at [facility], [address], [place], [place] [address]   Creatinine, Ser 08/13/2021 1.21 (A) 0.44 - 1.00 mg/dL Final    GFR, Estimated 08/13/2021 45 (A) >60 mL/min Final   Comment: (NOTE) Calculated using the CKD-EPI Creatinine Equation (2021) Performed at [facility], [place] [address], [place], [place] [address]    Hemoglobin 08/14/2021 8.9 (A) 12.0 - 15.0 g/dL Final   HCT 08/14/2021 28.2 (A) 36.0 - 46.0 % Final   Performed at [facility], [place] [address], [place], [place] [address]   Sodium 08/14/2021 135  135 - 145 mmol/L Final   Potassium 08/14/2021 4.5  3.5 - 5.1 mmol/L Final   Chloride 08/14/2021 106  98 - 111 mmol/L Final   CO2 08/14/2021 24  22 - 32 mmol/L Final   Glucose, Bld 08/14/2021 175 (A) 70 - 99 mg/dL Final   Glucose reference range applies only to samples taken after fasting for at least 8 hours.   BUN 08/14/2021 16  8 - 23 mg/dL Final   Creatinine, Ser 08/14/2021 1.25 (A) 0.44 - 1.00 mg/dL Final   Calcium 08/14/2021 7.9 (A) 8.9 - 10.3 mg/dL Final   GFR, Estimated 08/14/2021 44 (A) >60 mL/min Final   Comment: (NOTE) Calculated using the CKD-EPI Creatinine Equation (2021)    Anion gap 08/14/2021 5  5 - 15 Final   Performed at [facility], [place] [address] [place], [place] [address]  Hospital Outpatient Visit on 08/10/2021  Component  Date Value Ref Range Status   MRSA, PCR 08/10/2021 NEGATIVE  NEGATIVE Final   Staphylococcus aureus 08/10/2021 NEGATIVE  NEGATIVE Final   Comment: (NOTE) The Xpert SA Assay (FDA approved for NASAL specimens in patients 21 years of age and older), is one component of a comprehensive surveillance program. It is not intended to diagnose infection nor to guide or monitor treatment. Performed at [facility], [place] [address] [place], [place] [address]    WBC 08/10/2021 11.1 (A) 4.0 - 10.5 K/uL Final   RBC 08/10/2021 3.97  3.87 - 5.11 MIL/uL Final   Hemoglobin 08/10/2021 11.8 (A) 12.0 - 15.0 g/dL Final   HCT 08/10/2021 37.3  36.0 - 46.0 % Final   MCV 08/10/2021 94.0  80.0 - 100.0 fL Final   MCH 08/10/2021 29.7  26.0 - 34.0 pg Final    MCHC 08/10/2021 31.6  30.0 - 36.0 g/dL Final   RDW 08/10/2021 15.9 (A) 11.5 - 15.5 % Final   Platelets 08/10/2021 252  150 - 400 K/uL Final   nRBC 08/10/2021 0.0  0.0 - 0.2 % Final   Performed at [facility] [address], [place], [place] [address]   Sodium 08/10/2021 138  135 - 145 mmol/L Final   Potassium 08/10/2021 4.6  3.5 - 5.1 mmol/L Final   Chloride 08/10/2021 106  98 - 111 mmol/L Final   CO2 08/10/2021 23  22 - 32 mmol/L Final   Glucose, Bld 08/10/2021 100 (A) 70 - 99 mg/dL Final   Glucose reference range applies only to samples taken after fasting for at least 8 hours.   BUN 08/10/2021 22  8 - 23 mg/dL Final   Creatinine, Ser 08/10/2021 1.34 (A) 0.44 - 1.00 mg/dL Final   Calcium 08/10/2021 9.1  8.9 - 10.3 mg/dL Final   GFR, Estimated 08/10/2021 40 (A) >60 mL/min Final   Comment: (NOTE) Calculated using the CKD-EPI Creatinine Equation (2021)    Anion gap 08/10/2021 9  5 - 15 Final   Performed at [facility], [place] [address], [place], [place] [address]     X-Rays:DG Shoulder Left Port  Result Date: 08/13/2021 CLINICAL DATA:  Status post left reverse shoulder arthroplasty. EXAM: LEFT SHOULDER COMPARISON:  Same day. FINDINGS: The left glenoid and humeral components appear to be well situated. No fracture or dislocation is noted. IMPRESSION: Status post left reverse shoulder arthroplasty. Electronically Signed   By: Marijo Conception M.D.   On: 08/13/2021 16:12    EKG: Orders placed or performed during the hospital encounter of 08/10/21   EKG 12-Lead   EKG 12-Lead     Hospital Course: Teresa Kidd is a 80 y.o. who was admitted to Hospital. They were brought to the operating room on 08/13/2021 and underwent Procedure(s): [place].  Patient tolerated the procedure well and was later transferred to the recovery room and then to the orthopaedic floor for postoperative care.  They were given PO and IV analgesics for pain control following their  surgery.  They were given 24 hours of postoperative antibiotics of  Anti-infectives (From admission, onward)    Start     Dose/Rate Route Frequency Ordered Stop   08/13/21 1815  hydroxychloroquine (PLAQUENIL) tablet 200 mg  Status:  Discontinued        200 mg Oral Daily 08/13/21 1729 08/14/21 2058   08/13/21 1815  ceFAZolin (ANCEF) IVPB 1 g/50 mL premix        1 g 100 mL/hr  over 30 Minutes Intravenous Every 6 hours 08/13/21 1729 08/14/21 0556   08/13/21 1333  vancomycin (VANCOCIN) powder  Status:  Discontinued          As needed 08/13/21 1333 08/13/21 1504   08/13/21 1000  ceFAZolin (ANCEF) IVPB 2g/100 mL premix        2 g 200 mL/hr over 30 Minutes Intravenous On call to O.R. 08/13/21 7782 08/13/21 1322      and started on DVT prophylaxis in the form of Aspirin.    OT were ordered for total joint protocol.  Discharge planning consulted to help with postop disposition and equipment needs.  Patient had a great night on the evening of surgery.    Incision was healing well.  Patient was seen in rounds and was ready to go home.   Diet: Regular diet Activity:NWB Follow-up:in 2 weeks Disposition - Home Discharged Condition: good   Discharge Instructions     Call MD / Call 911   Complete by: As directed    If you experience chest pain or shortness of breath, CALL 911 and be transported to the hospital emergency room.  If you develope a fever above 101 F, pus (white drainage) or increased drainage or redness at the wound, or calf pain, call your surgeon's office.   Constipation Prevention   Complete by: As directed    Drink plenty of fluids.  Prune juice may be helpful.  You may use a stool softener, such as Colace (over the counter) 100 mg twice a day.  Use MiraLax (over the counter) for constipation as needed.   Diet - low sodium heart healthy   Complete by: As directed    Increase activity slowly as tolerated   Complete by: As directed    Post-operative opioid taper instructions:    Complete by: As directed    POST-OPERATIVE OPIOID TAPER INSTRUCTIONS: It is important to wean off of your opioid medication as soon as possible. If you do not need pain medication after your surgery it is ok to stop day one. Opioids include: Codeine, Hydrocodone(Norco, Vicodin), Oxycodone(Percocet, oxycontin) and hydromorphone amongst others.  Long term and even short term use of opiods can cause: Increased pain response Dependence Constipation Depression Respiratory depression And more.  Withdrawal symptoms can include Flu like symptoms Nausea, vomiting And more Techniques to manage these symptoms Hydrate well Eat regular healthy meals Stay active Use relaxation techniques(deep breathing, meditating, yoga) Do Not substitute Alcohol to help with tapering If you have been on opioids for less than two weeks and do not have pain than it is ok to stop all together.  Plan to wean off of opioids This plan should start within one week post op of your joint replacement. Maintain the same interval or time between taking each dose and first decrease the dose.  Cut the total daily intake of opioids by one tablet each day Next start to increase the time between doses. The last dose that should be eliminated is the evening dose.         Allergies as of 08/14/2021       Reactions   Feldene [piroxicam] Itching   In palm of hands & bottom of feets        Medication List     TAKE these medications    ADVANCED FIBER COMPLEX PO Take 3 tablets by mouth daily.   B-12 5000 MCG Caps Take 5,000 mcg by mouth daily.   Calcium 600+D 600-800 MG-UNIT Tabs Generic drug:  Calcium Carb-Cholecalciferol Take 1 tablet by mouth daily.   CENTRUM SILVER 50+WOMEN PO Take 1 tablet by mouth daily.   denosumab 60 MG/ML Soln injection Commonly known as: PROLIA Inject 60 mg into the skin every 6 (six) months. Administer in upper arm, thigh, or abdomen  Next dose due 11/2017   Dialyvite Omega-3  Concentrate 600 MG Caps Take 600 mg by mouth in the morning and at bedtime.   DULoxetine 30 MG capsule Commonly known as: CYMBALTA Take 30 mg by mouth daily.   folic acid 161 MCG tablet Commonly known as: FOLVITE Take 800 mcg by mouth daily.   gabapentin 300 MG capsule Commonly known as: NEURONTIN Take 600 mg by mouth 3 (three) times daily.   HYDROcodone-acetaminophen 5-325 MG tablet Commonly known as: Norco Take 1 tablet by mouth every 4 (four) hours as needed for moderate pain.   hydroxychloroquine 200 MG tablet Commonly known as: PLAQUENIL Take 200 mg by mouth daily.   levothyroxine 75 MCG tablet Commonly known as: SYNTHROID Take 75 mcg by mouth daily before breakfast.   lisinopril 5 MG tablet Commonly known as: ZESTRIL Take 5 mg by mouth daily.   Magnesium 250 MG Tabs Take 250 mg by mouth daily.   methocarbamol 500 MG tablet Commonly known as: ROBAXIN Take 1 tablet (500 mg total) by mouth every 6 (six) hours as needed for muscle spasms. What changed: Another medication with the same name was added. Make sure you understand how and when to take each.   methocarbamol 500 MG tablet Commonly known as: Robaxin Take 1 tablet (500 mg total) by mouth every 6 (six) hours as needed for muscle spasms. What changed: You were already taking a medication with the same name, and this prescription was added. Make sure you understand how and when to take each.   omeprazole 20 MG capsule Commonly known as: PRILOSEC Take 20 mg by mouth daily.   ondansetron 4 MG disintegrating tablet Commonly known as: Zofran ODT Take 1 tablet (4 mg total) by mouth every 8 (eight) hours as needed.   predniSONE 5 MG tablet Commonly known as: DELTASONE Take 5 mg by mouth daily with breakfast.   simvastatin 40 MG tablet Commonly known as: ZOCOR Take 40 mg by mouth daily.   Vitamin D3 25 MCG (1000 UT) Caps Take 1,000 Units by mouth daily.        Follow-up Information     Nicholes Stairs, MD Follow up in 2 week(s).   Specialty: Orthopedic Surgery Why: For wound re-check Contact information: [address] [place] [address] [phone]                 Signed: Geralynn Rile, MD Orthopaedic Surgery 08/15/2021, 3:15 PM

## 2021-08-16 ENCOUNTER — Encounter (HOSPITAL_COMMUNITY): Payer: Self-pay | Admitting: Orthopedic Surgery

## 2021-08-16 NOTE — Anesthesia Postprocedure Evaluation (Signed)
Anesthesia Post Note  Patient: Teresa Kidd  Procedure(s) Performed: REVERSE SHOULDER ARTHROPLASTY (Left: Shoulder)     Patient location during evaluation: PACU Anesthesia Type: Regional Level of consciousness: awake and alert Pain management: pain level controlled Vital Signs Assessment: post-procedure vital signs reviewed and stable Respiratory status: spontaneous breathing, nonlabored ventilation, respiratory function stable and patient connected to nasal cannula oxygen Cardiovascular status: blood pressure returned to baseline and stable Postop Assessment: no apparent nausea or vomiting Anesthetic complications: no   No notable events documented.  Last Vitals:  Vitals:   08/14/21 0847 08/14/21 1212  BP: (!) 157/61 (!) 118/54  Pulse: 62 65  Resp: 17 18  Temp: 36.7 C 36.9 C  SpO2: 98% 100%    Last Pain:  Vitals:   08/14/21 1212  TempSrc: Oral  PainSc:                  Hans Rusher L Syana Degraffenreid

## 2022-04-29 IMAGING — CT CT SHOULDER*L* W/O CM
3 series · 15 of 33 positions shown, 18 images · non-contrast
Comparison: None.

CLINICAL DATA: Preop for total left shoulder arthroplasty.

EXAM:
CT OF THE UPPER LEFT EXTREMITY WITHOUT CONTRAST
TECHNIQUE: Multidetector CT imaging of the upper left extremity was performed
according to the standard protocol.

[Series 4: left shoulder 2.00 br40 s3 axial soft · axial · 0.43mm/px · z∈[-1191,-1055]mm · 7 of 82 slices shown, 9 images]
[im 7/82  soft-tissue]
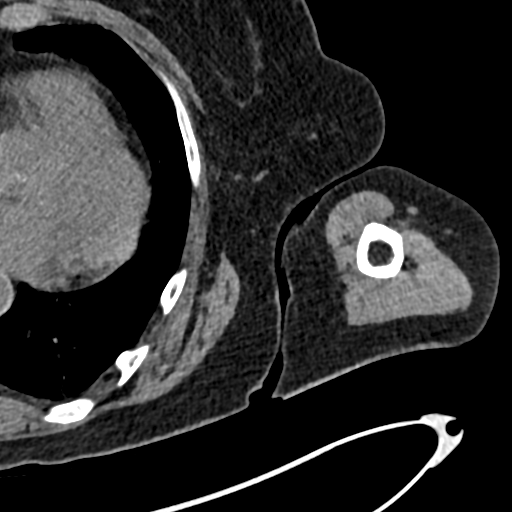
[im 7/82  bone]
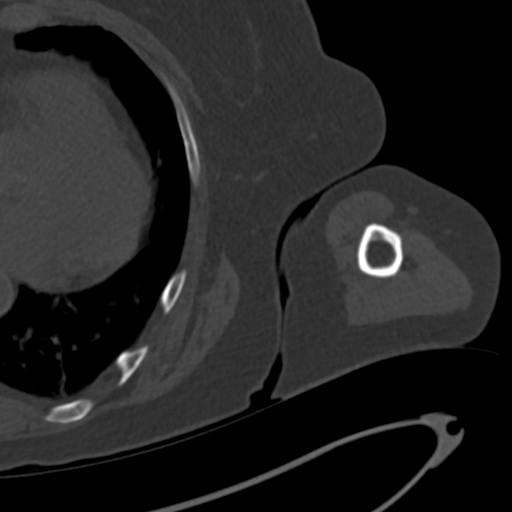
[im 19/82  bone]
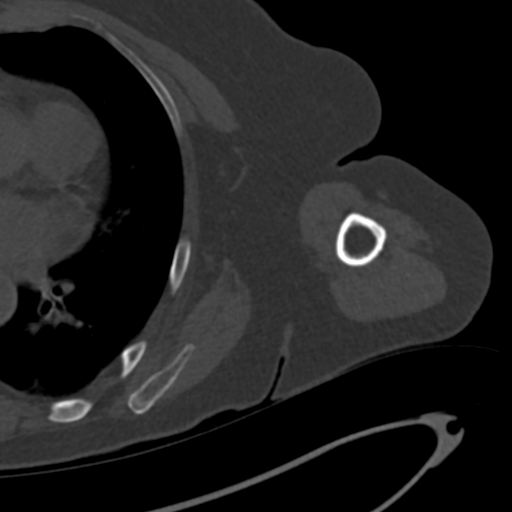
[im 32/82  bone]
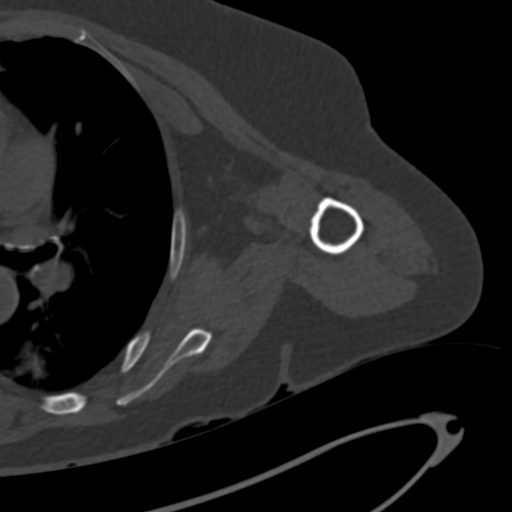
[im 44/82  bone]
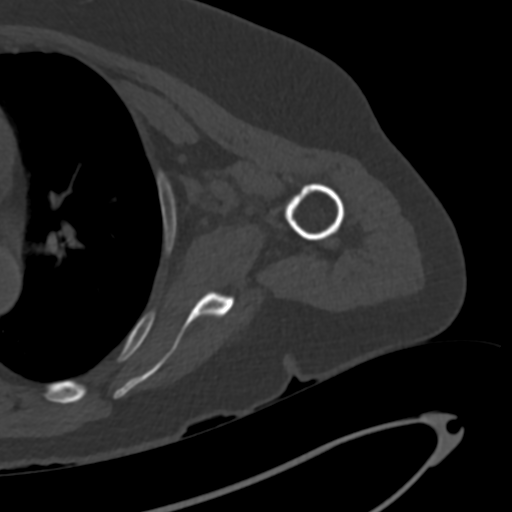
[im 50/82  soft-tissue]
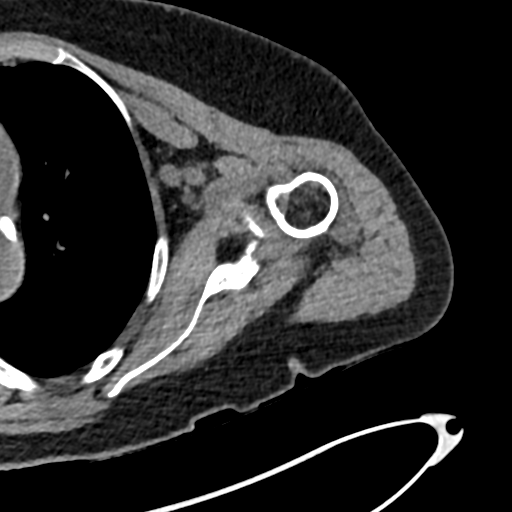
[im 50/82  bone]
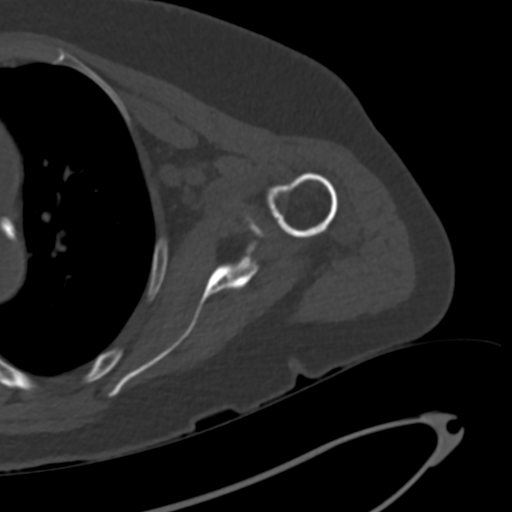
[im 63/82  bone]
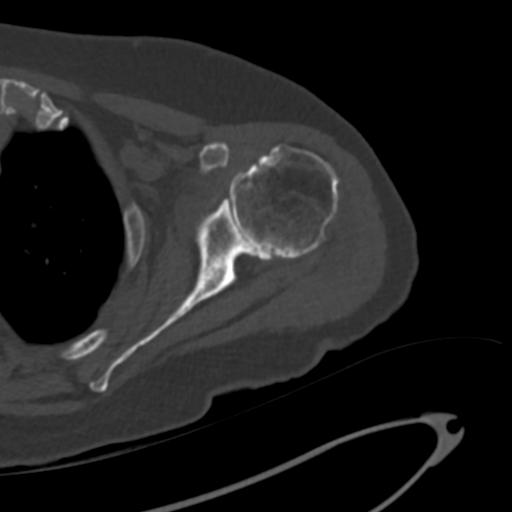
[im 75/82  bone]
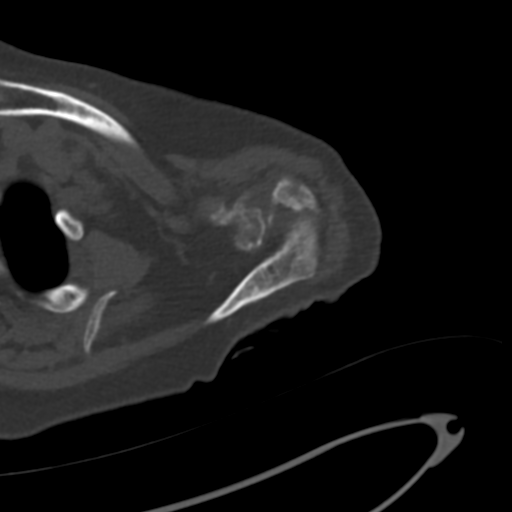

[Series 8: left shoulder 2.00 br40 s3 cor soft · coronal · 0.39mm/px · 3 of 111 slices shown]
[im 23/111  bone]
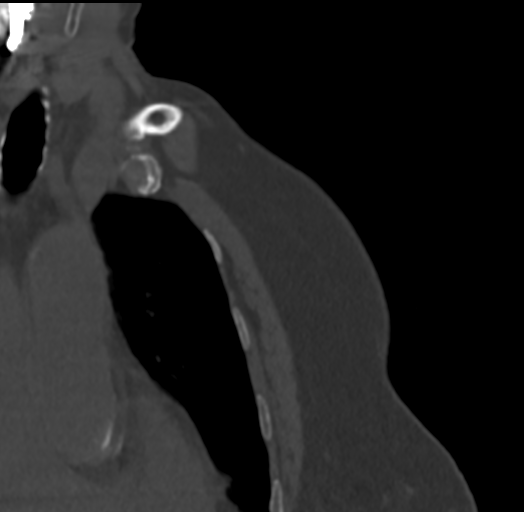
[im 45/111  bone]
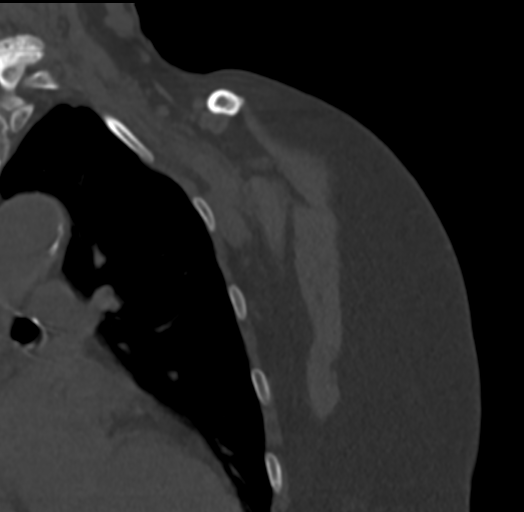
[im 67/111  bone]
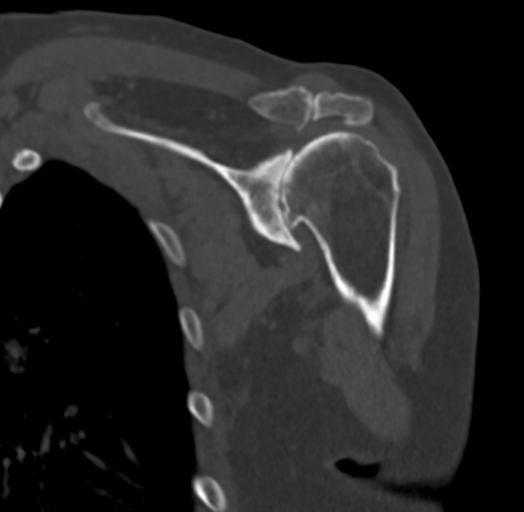

[Series 12: left shoulder 2.00 br40 s3 sag soft · sagittal · 0.36mm/px · 5 of 100 slices shown, 6 images]
[im 34/100  bone]
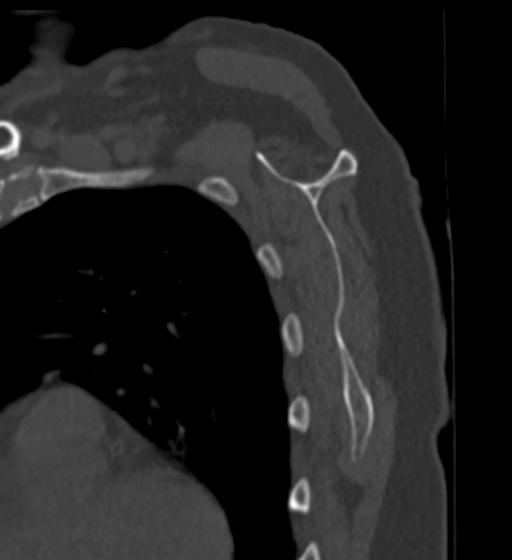
[im 42/100  bone]
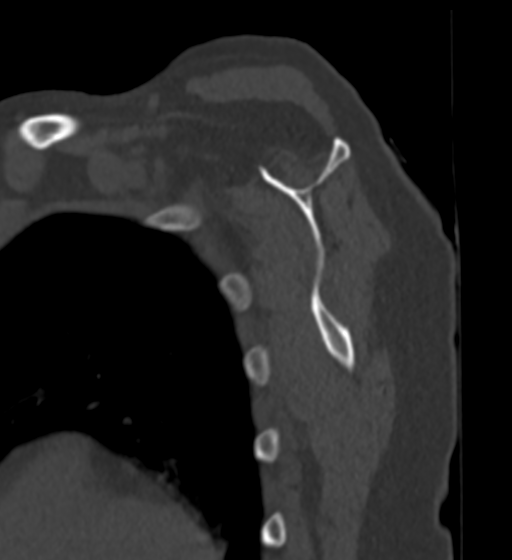
[im 50/100  soft-tissue]
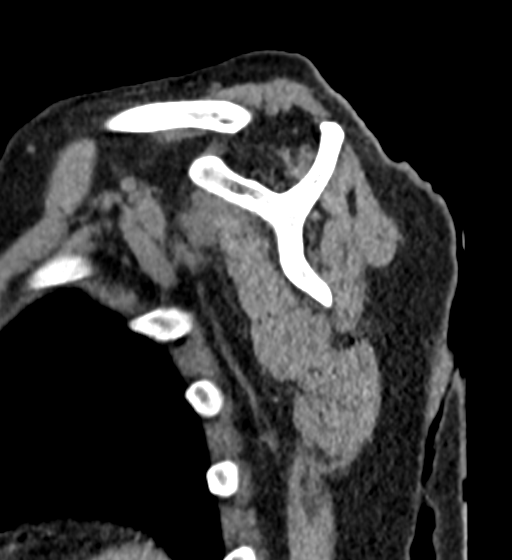
[im 50/100  bone]
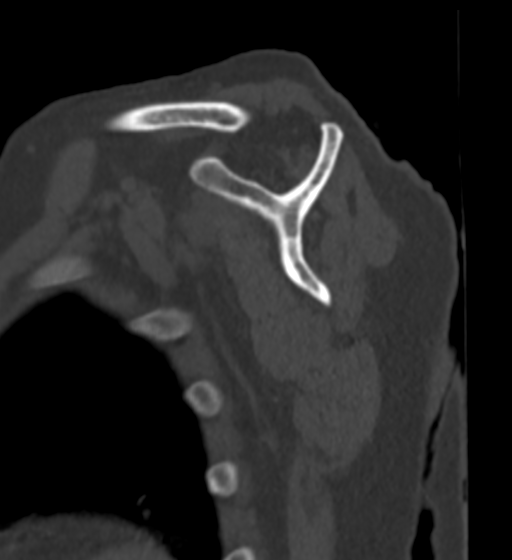
[im 58/100  bone]
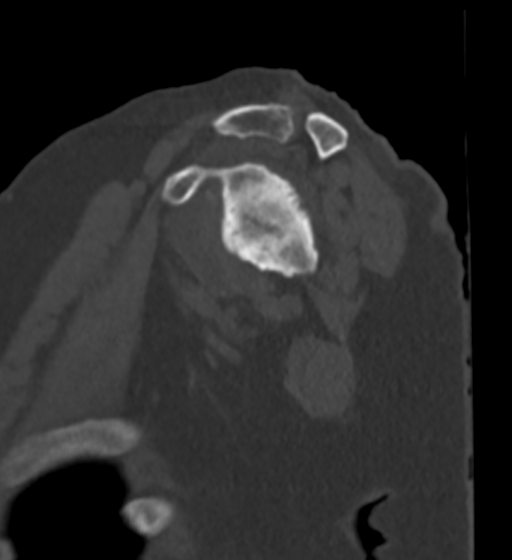
[im 67/100  bone]
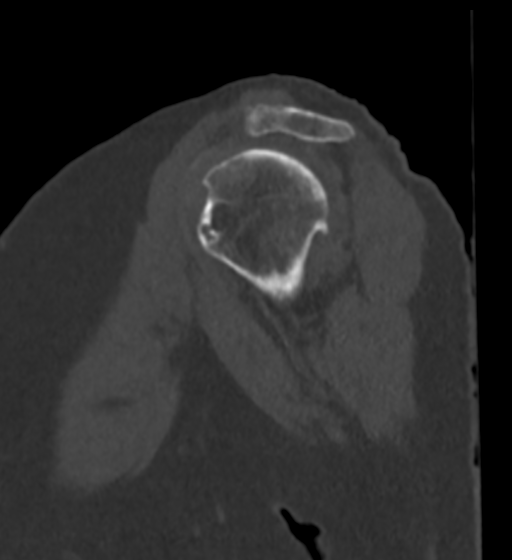

[15 of 33 positions shown; findings below may reference images not displayed]

FINDINGS: Severe/advanced glenohumeral joint degenerative changes with
full-thickness cartilage loss, marked joint space narrowing,
osteophytic spurring, subchondral cystic change and bony eburnation.
There is mild widening and thinning of the glenoid. No fracture or
AVN.

Moderate AC joint degenerative changes. The acromion is type 1-2 in
shape. No significant lateral downsloping but there is moderate
subacromial spurring noted.

Grossly by CT the infraspinatus and subscapularis tendons are
intact. Calcific tendinopathy involving the subscapularis tendon.
There is marked narrowing of the humeroacromial space and advanced
fatty atrophy of the supraspinatus muscle suggesting a chronic
complete tear. Moderate fluid noted in the subacromial/subdeltoid
bursa and also in the subcoracoid bursa.

The visualized left ribs are intact.

There is a patchy airspace opacity noted in the left lower lobe
which could reflect atelectasis an infiltrate. No pulmonary lesions
are identified.
IMPRESSION: 1. Severe/advanced glenohumeral joint degenerative changes.
2. Calcific tendinopathy involving the subscapularis tendon.
3. Severe narrowing of the humeroacromial space and advanced fatty
atrophy of the supraspinatus muscle suggesting a chronic complete
tear of the supraspinatus tendon.
4. Moderate AC joint degenerative changes and subacromial spurring.
5. Patchy airspace opacity in the left lower lobe could reflect
atelectasis an infiltrate.

## 2024-05-06 ENCOUNTER — Other Ambulatory Visit: Payer: Self-pay | Admitting: Orthopedic Surgery

## 2024-05-06 DIAGNOSIS — M19011 Primary osteoarthritis, right shoulder: Secondary | ICD-10-CM

## 2024-05-28 ENCOUNTER — Ambulatory Visit
Admission: RE | Admit: 2024-05-28 | Discharge: 2024-05-28 | Disposition: A | Discharge status: Home / Self Care | Source: Ambulatory Visit | Attending: Orthopedic Surgery

## 2024-05-28 DIAGNOSIS — M19011 Primary osteoarthritis, right shoulder: Secondary | ICD-10-CM

## 2024-06-02 NOTE — Patient Instructions (Addendum)
 SURGICAL WAITING ROOM VISITATION Patients having surgery or a procedure may have no more than 2 support people in the waiting area - these visitors may rotate in the visitor waiting room.   If the patient needs to stay at the hospital during part of their recovery, the visitor guidelines for inpatient rooms apply.  PRE-OP VISITATION  Pre-op nurse will coordinate an appropriate time for 1 support person to accompany the patient in pre-op.  This support person may not rotate.  This visitor will be contacted when the time is appropriate for the visitor to come back in the pre-op area.  Please refer to the [facility] website for the visitor guidelines for Inpatients (after your surgery is over and you are in a regular room).  You are not required to quarantine at this time prior to your surgery. However, you must do this: Hand Hygiene often Do NOT share personal items Notify your provider if you are in close contact with someone who has COVID or you develop fever 100.4 or greater, new onset of sneezing, cough, sore throat, shortness of breath or body aches.  If you test positive for Covid or have been in contact with anyone that has tested positive in the last 10 days please notify you surgeon.    Your procedure is scheduled on:  FRIDAY  June 14, 2024  Report to [facility] Main Entrance: [place] entrance where the [employer] is available.   Report to admitting at:   07:15  AM  Call this number if you have any questions or problems the morning of surgery [phone]  Do not eat food after Midnight the night prior to your surgery/procedure.  After Midnight you may have the following liquids until  06:45 AM DAY OF SURGERY  Clear Liquid Diet Water  Black Coffee (sugar ok, NO MILK/CREAM OR CREAMERS)  Tea (sugar ok, NO MILK/CREAM OR CREAMERS) regular and decaf                             Plain Jell-O  with no fruit (NO RED)                                           Fruit ices  (not with fruit pulp, NO RED)                                     Popsicles (NO RED)                                                                  Juice: NO CITRUS JUICES: only apple, WHITE grape, WHITE cranberry Sports drinks like Gatorade or Powerade (NO RED)                   The day of surgery:  Drink ONE (1) Pre-Surgery G2 at  07:15 AM the morning of surgery. Drink in one sitting. Do not sip.  This drink was given to you during your hospital pre-op appointment visit. Nothing else to drink after completing the  Pre-Surgery  G2 : No candy, chewing gum or throat lozenges.    FOLLOW ANY ADDITIONAL PRE OP INSTRUCTIONS YOU RECEIVED FROM YOUR SURGEON'S OFFICE!!!   Oral Hygiene is also important to reduce your risk of infection.        Remember - BRUSH YOUR TEETH THE MORNING OF SURGERY WITH YOUR REGULAR TOOTHPASTE  Do NOT smoke after Midnight the night before surgery.  STOP TAKING all Vitamins, Herbs and supplements 1 week before your surgery.   Take ONLY these medicines the morning of surgery with A SIP OF WATER : Omeprazole, levothyroxine , duloxetine , gabapentin   DO NOT TAKE hydroxychloroquine  (Plaquenil ), lisinopril  the morning of your surgery.                     You may not have any metal on your body including hair pins, jewelry, and body piercing  Do not wear make-up, lotions, powders, perfumes or deodorant  Do not wear nail polish including gel and S&S, artificial / acrylic nails, or any other type of covering on natural nails including finger and toenails. If you have artificial nails, gel coating, etc., that needs to be removed by a nail salon, Please have this removed prior to surgery. Not doing so may mean that your surgery could be cancelled or delayed if the Surgeon or anesthesia staff feels like they are unable to monitor you safely.   Do not shave 48 hours prior to surgery to avoid nicks in your skin which may contribute to postoperative infections.    Contacts,  Hearing Aids, dentures or bridgework may not be worn into surgery. DENTURES WILL BE REMOVED PRIOR TO SURGERY PLEASE DO NOT APPLY Poly grip OR ADHESIVES!!!  You may bring a small overnight bag with you on the day of surgery, only pack items that are not valuable. [facility] IS NOT RESPONSIBLE   FOR VALUABLES THAT ARE LOST OR STOLEN.   Patients discharged on the day of surgery will not be allowed to drive home.  Someone NEEDS to stay with you for the first 24 hours after anesthesia.  Do not bring your home medications to the hospital. The Pharmacy will dispense medications listed on your medication list to you during your admission in the Hospital.  Please read over the following fact sheets you were given: IF YOU HAVE QUESTIONS ABOUT YOUR PRE-OP INSTRUCTIONS, PLEASE CALL [phone].     Pre-operative 5 CHG Bath Instructions   You can play a key role in reducing the risk of infection after surgery. Your skin needs to be as free of germs as possible. You can reduce the number of germs on your skin by washing with CHG (chlorhexidine  gluconate) soap before surgery. CHG is an antiseptic soap that kills germs and continues to kill germs even after washing.   DO NOT use if you have an allergy to chlorhexidine /CHG or antibacterial soaps. If your skin becomes reddened or irritated, stop using the CHG and notify one of our RNs at [phone]  Please shower with the CHG soap starting 4 days before surgery using the following schedule: START SHOWERS ON   MONDAY  June 10, 2024  Please keep in mind the following:  DO NOT shave, including legs and underarms, starting the day of your first shower.   You may shave your face at any point before/day of surgery.   Place clean sheets on your bed the day you start using CHG soap. Use a  clean washcloth (not used since being washed) for each shower. DO NOT sleep with pets once you start using the CHG.   CHG Shower Instructions:  If you choose to wash your hair and private area, wash first with your normal shampoo/soap.  After you use shampoo/soap, rinse your hair and body thoroughly to remove shampoo/soap residue.  Turn the water  OFF and apply about 3 tablespoons (45 ml) of CHG soap to a CLEAN washcloth.  Apply CHG soap ONLY FROM YOUR NECK DOWN TO YOUR TOES (washing for 3-5 minutes)  DO NOT use CHG soap on face, private areas, open wounds, or sores.  Pay special attention to the area where your surgery is being performed.  If you are having back surgery, having someone wash your back for you may be helpful.  Wait 2 minutes after CHG soap is applied, then you may rinse off the CHG soap.  Pat dry with a clean towel  Put on clean clothes/pajamas   If you choose to wear lotion, please use ONLY the CHG-compatible lotions on the back of this paper.     Additional instructions for the day of surgery: DO NOT APPLY any lotions, deodorants, cologne, or perfumes.   Put on clean/comfortable clothes.  Brush your teeth.  Ask your nurse before applying any prescription medications to the skin.      CHG Compatible Lotions   Aveeno Moisturizing lotion  Cetaphil Moisturizing Cream  Cetaphil Moisturizing Lotion  Clairol Herbal Essence Moisturizing Lotion, Dry Skin  Clairol Herbal Essence Moisturizing Lotion, Extra Dry Skin  Clairol Herbal Essence Moisturizing Lotion, Normal Skin  Curel Age Defying Therapeutic Moisturizing Lotion with Alpha Hydroxy  Curel Extreme Care Body Lotion  Curel Soothing Hands Moisturizing Hand Lotion  Curel Therapeutic Moisturizing Cream, Fragrance-Free  Curel Therapeutic Moisturizing Lotion, Fragrance-Free  Curel Therapeutic Moisturizing Lotion, Original Formula  Eucerin Daily Replenishing Lotion  Eucerin Dry Skin Therapy Plus Alpha Hydroxy Crme   Eucerin Dry Skin Therapy Plus Alpha Hydroxy Lotion  Eucerin Original Crme  Eucerin Original Lotion  Eucerin Plus Crme Eucerin Plus Lotion  Eucerin TriLipid Replenishing Lotion  Keri Anti-Bacterial Hand Lotion  Keri Deep Conditioning Original Lotion Dry Skin Formula Softly Scented  Keri Deep Conditioning Original Lotion, Fragrance Free Sensitive Skin Formula  Keri Lotion Fast Absorbing Fragrance Free Sensitive Skin Formula  Keri Lotion Fast Absorbing Softly Scented Dry Skin Formula  Keri Original Lotion  Keri Skin Renewal Lotion Keri Silky Smooth Lotion  Keri Silky Smooth Sensitive Skin Lotion  Nivea Body Creamy Conditioning Oil  Nivea Body Extra Enriched Lotion  Nivea Body Original Lotion  Nivea Body Sheer Moisturizing Lotion Nivea Crme  Nivea Skin Firming Lotion  NutraDerm 30 Skin Lotion  NutraDerm Skin Lotion  NutraDerm Therapeutic Skin Cream  NutraDerm Therapeutic Skin Lotion  ProShield Protective Hand Cream  Provon moisturizing lotion     Preparing for Total Shoulder Arthroplasty ================================================================= Please follow these instructions carefully, in addition to any other special Bathing information that was explained to you at the Presurgical Appointment:  BENZOYL PEROXIDE 5% GEL: Used to kill bacteria on the skin which could cause an infection at the surgery site.   Please do not use if you have an  allergy to benzoyl peroxide. If your skin becomes reddened/irritated stop using the benzoyl peroxide and inform your Doctor.   Starting two days before surgery, apply as follows:  1. Apply benzoyl peroxide gel in the morning and at night. Apply after taking a shower. If you are not taking a shower, clean entire shoulder front, back, and side, along with the armpit with a clean wet washcloth.  2. Place a quarter-sized dollop of the gel on your SHOULDER and rub in thoroughly, making sure to cover the front, back, and side of your  shoulder, along with the armpit.   2 Days prior to Surgery    [facility]  June 12, 2024 First Application _______ Morning Second Application _______ Night  Day Before Surgery     THURSDAY  June 13, 2024 First Application______ Morning  On the night before surgery, wash your entire body (except hair, face and private areas) with CHG Soap. THEN, rub in the LAST application of the Benzoyl Peroxide Gel on your shoulder.   3. On the Morning of Surgery wash your BODY AGAIN with CHG Soap (except hair, face and private areas)  4. DO NOT USE THE BENZOYL PEROXIDE GEL ON THE DAY OF YOUR SURGERY       FAILURE TO FOLLOW THESE INSTRUCTIONS MAY RESULT IN THE CANCELLATION OF YOUR SURGERY  PATIENT SIGNATURE_________________________________  NURSE SIGNATURE__________________________________  ________________________________________________________________________         Teresa Kidd    An incentive spirometer is a tool that can help keep your lungs clear and active. This tool measures how well you are filling your lungs with each breath. Taking long deep breaths may help reverse or decrease the chance of developing breathing (pulmonary) problems (especially infection) following: A long period of time when you are unable to move or be active. BEFORE THE PROCEDURE  If the spirometer includes an indicator to show your best effort, your nurse or respiratory therapist will set it to a desired goal. If possible, sit up straight or lean slightly forward. Try not to slouch. Hold the incentive spirometer in an upright position. INSTRUCTIONS FOR USE  Sit on the edge of your bed if possible, or sit up as far as you can in bed or on a chair. Hold the incentive spirometer in an upright position. Breathe out normally. Place the mouthpiece in your mouth and seal your lips tightly around it. Breathe in slowly and as deeply as possible, raising the piston or the ball toward the top of the  column. Hold your breath for 3-5 seconds or for as long as possible. Allow the piston or ball to fall to the bottom of the column. Remove the mouthpiece from your mouth and breathe out normally. Rest for a few seconds and repeat Steps 1 through 7 at least 10 times every 1-2 hours when you are awake. Take your time and take a few normal breaths between deep breaths. The spirometer may include an indicator to show your best effort. Use the indicator as a goal to work toward during each repetition. After each set of 10 deep breaths, practice coughing to be sure your lungs are clear. If you have an incision (the cut made at the time of surgery), support your incision when coughing by placing a pillow or rolled up towels firmly against it. Once you are able to get out of bed, walk around indoors and cough well. You may stop using the incentive spirometer when instructed by your caregiver.  RISKS AND COMPLICATIONS Take your  time so you do not get dizzy or light-headed. If you are in pain, you may need to take or ask for pain medication before doing incentive spirometry. It is harder to take a deep breath if you are having pain. AFTER USE Rest and breathe slowly and easily. It can be helpful to keep track of a log of your progress. Your caregiver can provide you with a simple table to help with this. If you are using the spirometer at home, follow these instructions: SEEK MEDICAL CARE IF:  You are having difficultly using the spirometer. You have trouble using the spirometer as often as instructed. Your pain medication is not giving enough relief while using the spirometer. You develop fever of 100.5 F (38.1 C) or higher.                                                                                                    SEEK IMMEDIATE MEDICAL CARE IF:  You cough up bloody sputum that had not been present before. You develop fever of 102 F (38.9 C) or greater. You develop worsening pain at or near  the incision site. MAKE SURE YOU:  Understand these instructions. Will watch your condition. Will get help right away if you are not doing well or get worse. Document Released: 03/06/2007 Document Revised: 01/16/2012 Document Reviewed: 05/07/2007 [facility] Patient Information 2014 [place], [place].        If you would like to see a video about joint replacement:   [email or web address]

## 2024-06-02 NOTE — Progress Notes (Signed)
 COVID Vaccine received:  []  No [x]  Yes Date of any COVID positive Test in last 90 days:  PCP - Casandra Devonshire, DO in [facility] [phone]    Medical Clearance scanned in Media Cardiologist - Mearl Gene, MD  cardiac clearance in 04-04-24 CE note   [phone]  Nephrology- Watt Tamra SAILOR, MD  [facility] [phone] (Work) [phone] (Fax)   New patient 07-11-24  Chest x-ray -  EKG - 5-59-25 done [facility]-  Requested  Stress Test -  ECHO -  Cardiac Cath -  CT Coronary Calcium score:   Pacemaker / ICD device [x]  No []  Yes   Spinal Cord Stimulator:[x]  No []  Yes       History of Sleep Apnea? [x]  No []  Yes   CPAP used?- [x]  No []  Yes    Does the patient monitor blood sugar?   []  N/A   []  No []  Yes  Patient has: []  NO Hx DM   [x]  Pre-DM   []  DM1  []   DM2       NO MEDS  Blood Thinner / Instructions: Pradaxa? Dabigatron stopped in October 2024 d/t cost Aspirin  Instructions:  ERAS Protocol Ordered: []  No  [x]  Yes PRE-SURGERY []  ENSURE  [x]  G2  Patient is to be NPO after: 0545  Dental hx: []  Dentures:  []  N/A      []  Bridge or Partial:                   []  Loose or Damaged teeth:   Comments: The patient was given Benzoyl peroxide Gel as ordered. Instruction regarding application starting 2 days prior to surgery was given and patient voiced understanding.    Patient was given the 5 CHG shower / bath instructions for Reverse Shoulder arthroplasty surgery along with 2 bottles of the CHG soap. Patient will start this on:   Monday 06-10-2024      Activity level: Able to walk up 2 flights of stairs without becoming significantly short of breath or having chest pain?  []  No   []    Yes   Anesthesia review: HTN, GERD, Hx DVT ? 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  Patient verbalized  understanding and agreement to the Pre-Surgical Instructions that were given to them at this PAT appointment. Patient was also educated of the need to review these PAT instructions again prior to her surgery.I reviewed the appropriate phone numbers to call if they have any and questions or concerns.

## 2024-06-03 ENCOUNTER — Encounter (HOSPITAL_COMMUNITY): Payer: Self-pay

## 2024-06-03 ENCOUNTER — Other Ambulatory Visit: Payer: Self-pay

## 2024-06-03 ENCOUNTER — Encounter (HOSPITAL_COMMUNITY)
Admission: RE | Admit: 2024-06-03 | Discharge: 2024-06-03 | Disposition: A | Discharge status: Home / Self Care | Source: Ambulatory Visit | Attending: Orthopedic Surgery | Admitting: Orthopedic Surgery

## 2024-06-03 VITALS — BP 114/54 | HR 72 | Temp 98.5°F | Resp 14 | Ht 60.0 in | Wt 117.0 lb

## 2024-06-03 DIAGNOSIS — I10 Essential (primary) hypertension: Secondary | ICD-10-CM | POA: Diagnosis not present

## 2024-06-03 DIAGNOSIS — Z01818 Encounter for other preprocedural examination: Secondary | ICD-10-CM | POA: Insufficient documentation

## 2024-06-03 HISTORY — DX: Chronic kidney disease, unspecified: N18.9

## 2024-06-03 HISTORY — DX: Fibromyalgia: M79.7

## 2024-06-03 HISTORY — DX: Prediabetes: R73.03

## 2024-06-03 HISTORY — DX: Anxiety disorder, unspecified: F41.9

## 2024-06-03 LAB — BASIC METABOLIC PANEL WITH GFR
Anion gap: 9 (ref 5–15)
BUN: 33 mg/dL — ABNORMAL HIGH (ref 8–23)
CO2: 24 mmol/L (ref 22–32)
Calcium: 10.3 mg/dL (ref 8.9–10.3)
Chloride: 109 mmol/L (ref 98–111)
Creatinine, Ser: 1.2 mg/dL — ABNORMAL HIGH (ref 0.44–1.00)
GFR, Estimated: 45 mL/min — ABNORMAL LOW (ref >60)
Glucose, Bld: 97 mg/dL (ref 70–99)
Potassium: 4.9 mmol/L (ref 3.5–5.1)
Sodium: 142 mmol/L (ref 135–145)

## 2024-06-03 LAB — CBC
HCT: 37.1 % (ref 36.0–46.0)
Hemoglobin: 11.2 g/dL — ABNORMAL LOW (ref 12.0–15.0)
MCH: 31.7 pg (ref 26.0–34.0)
MCHC: 30.2 g/dL (ref 30.0–36.0)
MCV: 105.1 fL — ABNORMAL HIGH (ref 80.0–100.0)
Platelets: 226 K/uL (ref 150–400)
RBC: 3.53 MIL/uL — ABNORMAL LOW (ref 3.87–5.11)
RDW: 14.2 % (ref 11.5–15.5)
WBC: 7.6 K/uL (ref 4.0–10.5)
nRBC: 0 % (ref 0.0–0.2)

## 2024-06-03 LAB — SURGICAL PCR SCREEN
MRSA, PCR: NEGATIVE
Staphylococcus aureus: NEGATIVE

## 2024-06-04 NOTE — Progress Notes (Signed)
 Case: [id number] Date/Time: 06/14/24 0930   Procedure: ARTHROPLASTY, SHOULDER, TOTAL, REVERSE (Right: Shoulder) - please put knee to follow this case   Anesthesia type: Choice   Pre-op diagnosis: Right shoulder osteoarthritis   Location: [facility] ROOM 08 / [facility]   Surgeons: Sharl Selinda Dover, MD       DISCUSSION: Teresa Kidd is an 83 yo female with PMH of HTN, hx of DVT, GERD, prediabetes, hypothyroid, CKD, anemia, arthritis, anxiety.  Prior anesthesia complications include PONV. Family hx of anesthesia reaction? Patient has had multiple surgeries at [facility] which have been uncomplicated.  Patient examined in PAT due to rash. She has a erythematous rash with vesicular lesions over the left hip that looks like shingles. Patient states she has had shingles in the past and it's similar. Advised patient to contact her PCP to start antiviral.  Patient is supposed to be on an anticoagulant for hx of unprovoked DVT however has been off for some time due to cost.  Patient follows with Cardiology in [facility] for venous insufficiency. Last seen on 04/04/24 for routine f/u. All issues stable. She was advised to f/u with PCP regarding cost of blood thinner. She was cleared for surgery:  Patient is being evaluated for shoulder surgery. Blood pressure is well-controlled, her EKG shows normal sinus rhythm with no ischemia, she denies having any exertional rest chest pain tightness or burning in the chest, just released from room in [facility]. She is euvolemic by exam. There is no modifiable risk that will affect her outcome, she is stable to proceed with planned surgery   Addendum 8/5:  Called patient to check on her shingles rash. She states she saw her PCP who put her on a steroid cream and antiviral. The rash is crusted over/healed. Anticipate she can proceed.  VS: BP (!) 114/54 Comment: left arm sitting  Pulse 72   Temp 36.9 C (Oral)   Resp 14   Ht 5' (1.524 m)   Wt 53.1 kg   SpO2 98%    BMI 22.85 kg/m   PROVIDERS: Debarah Casandra BRAVO, DO Cardiologist - Mearl Gene, MD Nephrology- [place], Tamra SAILOR, MD  [facility] [place]  LABS: Labs reviewed: Acceptable for surgery. (all labs ordered are listed, but only abnormal results are displayed)  Labs Reviewed  BASIC METABOLIC PANEL WITH GFR - Abnormal; Notable for the following components:      Result Value   BUN 33 (*)    Creatinine, Ser 1.20 (*)    GFR, Estimated 45 (*)    All other components within normal limits  CBC - Abnormal; Notable for the following components:   RBC 3.53 (*)    Hemoglobin 11.2 (*)    MCV 105.1 (*)    All other components within normal limits  SURGICAL PCR SCREEN     IMAGES:   EKG:   CV:  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   Dehydration    with AKI   DVT (deep venous thrombosis) (HCC) 01/18/2016   left leg   Family history of adverse reaction to anesthesia    Fibromyalgia    GERD (gastroesophageal reflux disease)    History of kidney stones 2006-2007   Hypertension    Hypothyroidism    PONV (postoperative nausea and vomiting)    motion  sickness   Pre-diabetes     Past Surgical History:  Procedure Laterality Date   APPENDECTOMY     BACK SURGERY  1994   herniated disc, DJD , Stabalizers , Spinal stenosis surgery   BREAST SURGERY     lumpectomy left breast   CARPAL TUNNEL RELEASE  1985   CHOLECYSTECTOMY  1976   COLONOSCOPY     polyps removed   CYSTOCELE REPAIR N/A 08/22/2017   Procedure: ANTERIOR (CYSTOCELE) WITH GRAFT;  Surgeon: Gaston Hamilton, MD;  [facility];  Service: Urology;  Laterality: N/A;   CYSTOSCOPY N/A 08/22/2017   Procedure: CYSTOSCOPY;  Surgeon: Gaston Hamilton, MD;  [facility];  Service: Urology;  Laterality: N/A;   DILATATION & CURETTAGE/HYSTEROSCOPY WITH MYOSURE N/A 10/24/2016   Procedure: DILATATION &  CURETTAGE/HYSTEROSCOPY WITH MYOSURE;  Surgeon: Dickie Carder, MD;  Location: [facility];  Service: Gynecology;  Laterality: N/A;   REVERSE SHOULDER ARTHROPLASTY Left 08/13/2021   Procedure: REVERSE SHOULDER ARTHROPLASTY;  Surgeon: Sharl Selinda Dover, MD;  Location: [facility] OR;  Service: Orthopedics;  Laterality: Left;   ROTATOR CUFF REPAIR     SALPINGOOPHORECTOMY Bilateral 08/22/2017   Procedure: BILATERAL SALPINGO OOPHORECTOMY;  Surgeon: Carder Dickie, MD;  [facility];  Service: Gynecology;  Laterality: Bilateral;   THYROIDECTOMY  1985   TOTAL HIP ARTHROPLASTY Right 02/23/2021   Procedure: TOTAL HIP ARTHROPLASTY ANTERIOR APPROACH;  Surgeon: Ernie Cough, MD;  [facility];  Service: Orthopedics;  Laterality: Right;   VAGINAL HYSTERECTOMY N/A 08/22/2017   Procedure: TOTAL HYSTERECTOMY VAGINAL;  Surgeon: Carder Dickie, MD;  [facility];  Service: Gynecology;  Laterality: N/A;   VAGINAL PROLAPSE REPAIR N/A 08/22/2017   Procedure: VAGINAL VAULT PROLAPSE REPAIR;  Surgeon: Gaston Hamilton, MD;  [facility];  Service: Urology;  Laterality: N/A;    MEDICATIONS:  acetaminophen  (TYLENOL ) 500 MG tablet   Cyanocobalamin (B-12) 5000 MCG CAPS   DULoxetine  (CYMBALTA ) 30 MG capsule   DULoxetine  (CYMBALTA ) 60 MG capsule   folic acid  (FOLVITE ) 1 MG tablet   gabapentin  (NEURONTIN ) 600 MG tablet   hydroxychloroquine  (PLAQUENIL ) 200 MG tablet   levothyroxine  (SYNTHROID , LEVOTHROID) 75 MCG tablet   lisinopril  (ZESTRIL ) 10 MG tablet   Magnesium  250 MG TABS   meclizine  (ANTIVERT ) 12.5 MG tablet   omeprazole (PRILOSEC) 20 MG capsule   potassium chloride  SA (KLOR-CON  M) 20 MEQ tablet   predniSONE  (DELTASONE ) 5 MG tablet   rosuvastatin  (CRESTOR ) 10 MG tablet   No current facility-administered medications for this encounter.    Burnard CHRISTELLA Odis DEVONNA [facility] Short Stay/Anesthesiology [facility] Phone [phone] 06/11/2024 12:45 PM

## 2024-06-14 ENCOUNTER — Other Ambulatory Visit: Payer: Self-pay

## 2024-06-14 ENCOUNTER — Encounter (HOSPITAL_COMMUNITY): Payer: Self-pay | Admitting: Orthopedic Surgery

## 2024-06-14 ENCOUNTER — Observation Stay (HOSPITAL_COMMUNITY)

## 2024-06-14 ENCOUNTER — Ambulatory Visit (HOSPITAL_BASED_OUTPATIENT_CLINIC_OR_DEPARTMENT_OTHER): Payer: Self-pay

## 2024-06-14 ENCOUNTER — Observation Stay (HOSPITAL_COMMUNITY)
Admission: RE | Admit: 2024-06-14 | Discharge: 2024-06-16 | Disposition: A | Discharge status: Home Health | Attending: Orthopedic Surgery | Admitting: Orthopedic Surgery

## 2024-06-14 ENCOUNTER — Ambulatory Visit (HOSPITAL_COMMUNITY): Payer: Self-pay | Admitting: Physician Assistant

## 2024-06-14 ENCOUNTER — Encounter (HOSPITAL_COMMUNITY)
Admission: RE | Disposition: A | Discharge status: Home Health | Payer: Self-pay | Source: Home / Self Care | Attending: Orthopedic Surgery

## 2024-06-14 DIAGNOSIS — I129 Hypertensive chronic kidney disease with stage 1 through stage 4 chronic kidney disease, or unspecified chronic kidney disease: Secondary | ICD-10-CM | POA: Insufficient documentation

## 2024-06-14 DIAGNOSIS — N184 Chronic kidney disease, stage 4 (severe): Secondary | ICD-10-CM | POA: Insufficient documentation

## 2024-06-14 DIAGNOSIS — M19011 Primary osteoarthritis, right shoulder: Secondary | ICD-10-CM | POA: Diagnosis present

## 2024-06-14 DIAGNOSIS — Z96611 Presence of right artificial shoulder joint: Principal | ICD-10-CM | POA: Insufficient documentation

## 2024-06-14 DIAGNOSIS — N189 Chronic kidney disease, unspecified: Secondary | ICD-10-CM | POA: Diagnosis not present

## 2024-06-14 DIAGNOSIS — M12811 Other specific arthropathies, not elsewhere classified, right shoulder: Secondary | ICD-10-CM | POA: Diagnosis not present

## 2024-06-14 DIAGNOSIS — E039 Hypothyroidism, unspecified: Secondary | ICD-10-CM

## 2024-06-14 HISTORY — PX: REVERSE SHOULDER ARTHROPLASTY: SHX5054

## 2024-06-14 LAB — CBC
HCT: 32.1 % — ABNORMAL LOW (ref 36.0–46.0)
Hemoglobin: 9.9 g/dL — ABNORMAL LOW (ref 12.0–15.0)
MCH: 32 pg (ref 26.0–34.0)
MCHC: 30.8 g/dL (ref 30.0–36.0)
MCV: 103.9 fL — ABNORMAL HIGH (ref 80.0–100.0)
Platelets: 156 K/uL (ref 150–400)
RBC: 3.09 MIL/uL — ABNORMAL LOW (ref 3.87–5.11)
RDW: 14.2 % (ref 11.5–15.5)
WBC: 9 K/uL (ref 4.0–10.5)
nRBC: 0 % (ref 0.0–0.2)

## 2024-06-14 LAB — CREATININE, SERUM
Creatinine, Ser: 1.27 mg/dL — ABNORMAL HIGH (ref 0.44–1.00)
GFR, Estimated: 42 mL/min — ABNORMAL LOW (ref >60)

## 2024-06-14 SURGERY — ARTHROPLASTY, SHOULDER, TOTAL, REVERSE
Anesthesia: Regional | Site: Shoulder | Laterality: Right

## 2024-06-14 MED ORDER — MORPHINE SULFATE (PF) 2 MG/ML IV SOLN: 0.5000 mg | INTRAVENOUS | Status: DC | PRN

## 2024-06-14 MED ORDER — DEXMEDETOMIDINE HCL IN NACL 80 MCG/20ML IV SOLN
INTRAVENOUS | Status: DC | PRN
Start: 2024-06-14 — End: 2024-06-14
  Administered 2024-06-14: 8 ug via INTRAVENOUS

## 2024-06-14 MED ORDER — MAGNESIUM OXIDE -MG SUPPLEMENT 400 (240 MG) MG PO TABS
200.0000 mg | ORAL_TABLET | Freq: Every day | ORAL | Status: DC
  Administered 2024-06-15 – 2024-06-16 (×2): 200 mg via ORAL
  Filled 2024-06-14 (×2): qty 1

## 2024-06-14 MED ORDER — FOLIC ACID 1 MG PO TABS
1.0000 mg | ORAL_TABLET | Freq: Every day | ORAL | Status: DC
  Administered 2024-06-14 – 2024-06-16 (×3): 1 mg via ORAL
  Filled 2024-06-14 (×3): qty 1

## 2024-06-14 MED ORDER — TRANEXAMIC ACID-NACL 1000-0.7 MG/100ML-% IV SOLN
1000.0000 mg | INTRAVENOUS | Status: AC
  Administered 2024-06-14: 1000 mg via INTRAVENOUS
  Filled 2024-06-14: qty 100

## 2024-06-14 MED ORDER — BUPIVACAINE HCL (PF) 0.5 % IJ SOLN
INTRAMUSCULAR | Status: DC | PRN
  Administered 2024-06-14: 10 mL via PERINEURAL

## 2024-06-14 MED ORDER — OXYCODONE HCL 5 MG/5ML PO SOLN: 5.0000 mg | Freq: Once | ORAL | Status: DC | PRN

## 2024-06-14 MED ORDER — CEFAZOLIN SODIUM-DEXTROSE 2-4 GM/100ML-% IV SOLN: 2.0000 g | Freq: Four times a day (QID) | INTRAVENOUS | Status: DC

## 2024-06-14 MED ORDER — ACETAMINOPHEN 10 MG/ML IV SOLN
INTRAVENOUS | Status: DC | PRN
  Administered 2024-06-14: 1000 mg via INTRAVENOUS

## 2024-06-14 MED ORDER — HYDROXYCHLOROQUINE SULFATE 200 MG PO TABS
200.0000 mg | ORAL_TABLET | Freq: Every day | ORAL | Status: DC
  Administered 2024-06-14 – 2024-06-16 (×3): 200 mg via ORAL
  Filled 2024-06-14 (×3): qty 1

## 2024-06-14 MED ORDER — METOCLOPRAMIDE HCL 5 MG PO TABS: 5.0000 mg | ORAL_TABLET | Freq: Three times a day (TID) | ORAL | Status: DC | PRN

## 2024-06-14 MED ORDER — VANCOMYCIN HCL 1000 MG IV SOLR
INTRAVENOUS | Status: DC | PRN
  Administered 2024-06-14: 1000 mg via TOPICAL

## 2024-06-14 MED ORDER — LACTATED RINGERS IV SOLN: INTRAVENOUS | Status: DC

## 2024-06-14 MED ORDER — DROPERIDOL 2.5 MG/ML IJ SOLN
0.6250 mg | Freq: Once | INTRAMUSCULAR | Status: DC | PRN
Start: 2024-06-14 — End: 2024-06-14

## 2024-06-14 MED ORDER — CHLORHEXIDINE GLUCONATE 0.12 % MT SOLN
15.0000 mL | Freq: Once | OROMUCOSAL | Status: AC
  Administered 2024-06-14: 15 mL via OROMUCOSAL

## 2024-06-14 MED ORDER — DEXAMETHASONE SODIUM PHOSPHATE 10 MG/ML IJ SOLN
INTRAMUSCULAR | Status: DC | PRN
  Administered 2024-06-14: 8 mg via INTRAVENOUS

## 2024-06-14 MED ORDER — PANTOPRAZOLE SODIUM 40 MG PO TBEC
40.0000 mg | DELAYED_RELEASE_TABLET | Freq: Every day | ORAL | Status: DC
  Administered 2024-06-15 – 2024-06-16 (×2): 40 mg via ORAL
  Filled 2024-06-14 (×2): qty 1

## 2024-06-14 MED ORDER — ROCURONIUM BROMIDE 10 MG/ML (PF) SYRINGE
PREFILLED_SYRINGE | INTRAVENOUS | Status: AC
  Filled 2024-06-14: qty 10

## 2024-06-14 MED ORDER — ONDANSETRON HCL 4 MG PO TABS: 4.0000 mg | ORAL_TABLET | Freq: Four times a day (QID) | ORAL | Status: DC | PRN

## 2024-06-14 MED ORDER — PROPOFOL 1000 MG/100ML IV EMUL
INTRAVENOUS | Status: AC
  Filled 2024-06-14: qty 100

## 2024-06-14 MED ORDER — ACETAMINOPHEN 10 MG/ML IV SOLN: 1000.0000 mg | Freq: Once | INTRAVENOUS | Status: DC | PRN

## 2024-06-14 MED ORDER — PROPOFOL 10 MG/ML IV BOLUS
INTRAVENOUS | Status: AC
  Filled 2024-06-14: qty 20

## 2024-06-14 MED ORDER — ISOPROPYL ALCOHOL 70 % SOLN
Status: DC | PRN
  Administered 2024-06-14: 1 via TOPICAL

## 2024-06-14 MED ORDER — LIDOCAINE HCL (PF) 2 % IJ SOLN
INTRAMUSCULAR | Status: AC
  Filled 2024-06-14: qty 5

## 2024-06-14 MED ORDER — FENTANYL CITRATE (PF) 100 MCG/2ML IJ SOLN
INTRAMUSCULAR | Status: AC
Start: 2024-06-14 — End: 2024-06-14
  Filled 2024-06-14: qty 2

## 2024-06-14 MED ORDER — HYDROCODONE-ACETAMINOPHEN 7.5-325 MG PO TABS: 1.0000 | ORAL_TABLET | ORAL | Status: DC | PRN

## 2024-06-14 MED ORDER — LEVOTHYROXINE SODIUM 75 MCG PO TABS
75.0000 ug | ORAL_TABLET | Freq: Every day | ORAL | Status: DC
  Administered 2024-06-15 – 2024-06-16 (×2): 75 ug via ORAL
  Filled 2024-06-14 (×2): qty 1

## 2024-06-14 MED ORDER — DOCUSATE SODIUM 100 MG PO CAPS
100.0000 mg | ORAL_CAPSULE | Freq: Two times a day (BID) | ORAL | Status: DC
  Administered 2024-06-14 – 2024-06-16 (×4): 100 mg via ORAL
  Filled 2024-06-14 (×4): qty 1

## 2024-06-14 MED ORDER — LISINOPRIL 10 MG PO TABS
10.0000 mg | ORAL_TABLET | Freq: Every day | ORAL | Status: DC
  Administered 2024-06-15 – 2024-06-16 (×2): 10 mg via ORAL
  Filled 2024-06-14 (×2): qty 1

## 2024-06-14 MED ORDER — PROPOFOL 10 MG/ML IV BOLUS
INTRAVENOUS | Status: DC | PRN
  Administered 2024-06-14: 20 mg via INTRAVENOUS
  Administered 2024-06-14: 50 mg via INTRAVENOUS

## 2024-06-14 MED ORDER — ESMOLOL HCL 100 MG/10ML IV SOLN
INTRAVENOUS | Status: AC
  Filled 2024-06-14: qty 10

## 2024-06-14 MED ORDER — ROSUVASTATIN CALCIUM 10 MG PO TABS
10.0000 mg | ORAL_TABLET | Freq: Every day | ORAL | Status: DC
  Administered 2024-06-14 – 2024-06-15 (×2): 10 mg via ORAL
  Filled 2024-06-14 (×2): qty 1

## 2024-06-14 MED ORDER — MENTHOL 3 MG MT LOZG: 1.0000 | LOZENGE | OROMUCOSAL | Status: DC | PRN

## 2024-06-14 MED ORDER — PHENYLEPHRINE HCL-NACL 20-0.9 MG/250ML-% IV SOLN
INTRAVENOUS | Status: DC | PRN
Start: 2024-06-14 — End: 2024-06-14
  Administered 2024-06-14: 40 ug/min via INTRAVENOUS

## 2024-06-14 MED ORDER — DULOXETINE HCL 30 MG PO CPEP
30.0000 mg | ORAL_CAPSULE | Freq: Every day | ORAL | Status: DC
  Administered 2024-06-15 – 2024-06-16 (×2): 30 mg via ORAL
  Filled 2024-06-14 (×2): qty 1

## 2024-06-14 MED ORDER — HYDROCODONE-ACETAMINOPHEN 5-325 MG PO TABS: 1.0000 | ORAL_TABLET | Freq: Four times a day (QID) | ORAL | 0 refills | Status: AC | PRN

## 2024-06-14 MED ORDER — ACETAMINOPHEN 325 MG PO TABS
325.0000 mg | ORAL_TABLET | Freq: Four times a day (QID) | ORAL | Status: DC | PRN
  Administered 2024-06-16: 650 mg via ORAL
  Filled 2024-06-14: qty 2

## 2024-06-14 MED ORDER — ONDANSETRON HCL 4 MG/2ML IJ SOLN
INTRAMUSCULAR | Status: DC | PRN
  Administered 2024-06-14: 4 mg via INTRAVENOUS

## 2024-06-14 MED ORDER — 0.9 % SODIUM CHLORIDE (POUR BTL) OPTIME
TOPICAL | Status: DC | PRN
  Administered 2024-06-14: 1000 mL

## 2024-06-14 MED ORDER — METHOCARBAMOL 500 MG PO TABS: 500.0000 mg | ORAL_TABLET | Freq: Four times a day (QID) | ORAL | Status: DC | PRN

## 2024-06-14 MED ORDER — B-12 5000 MCG PO CAPS: 5000.0000 ug | ORAL_CAPSULE | Freq: Every day | ORAL | Status: DC

## 2024-06-14 MED ORDER — FENTANYL CITRATE (PF) 100 MCG/2ML IJ SOLN
INTRAMUSCULAR | Status: DC | PRN
  Administered 2024-06-14: 50 ug via INTRAVENOUS

## 2024-06-14 MED ORDER — CEFAZOLIN SODIUM-DEXTROSE 2-4 GM/100ML-% IV SOLN
2.0000 g | INTRAVENOUS | Status: AC
  Administered 2024-06-14: 2 g via INTRAVENOUS
  Filled 2024-06-14: qty 100

## 2024-06-14 MED ORDER — PROPOFOL 500 MG/50ML IV EMUL
INTRAVENOUS | Status: DC | PRN
  Administered 2024-06-14: 125 ug/kg/min via INTRAVENOUS

## 2024-06-14 MED ORDER — CEFAZOLIN SODIUM-DEXTROSE 2-4 GM/100ML-% IV SOLN
2.0000 g | Freq: Once | INTRAVENOUS | Status: AC
  Administered 2024-06-14: 2 g via INTRAVENOUS
  Filled 2024-06-14: qty 100

## 2024-06-14 MED ORDER — ORAL CARE MOUTH RINSE: 15.0000 mL | Freq: Once | OROMUCOSAL | Status: AC

## 2024-06-14 MED ORDER — POTASSIUM CHLORIDE CRYS ER 20 MEQ PO TBCR
20.0000 meq | EXTENDED_RELEASE_TABLET | Freq: Two times a day (BID) | ORAL | Status: DC
  Administered 2024-06-14 – 2024-06-16 (×4): 20 meq via ORAL
  Filled 2024-06-14 (×4): qty 1

## 2024-06-14 MED ORDER — VANCOMYCIN HCL 1000 MG IV SOLR
INTRAVENOUS | Status: AC
  Filled 2024-06-14: qty 20

## 2024-06-14 MED ORDER — METHOCARBAMOL 1000 MG/10ML IJ SOLN: 500.0000 mg | Freq: Four times a day (QID) | INTRAMUSCULAR | Status: DC | PRN

## 2024-06-14 MED ORDER — FENTANYL CITRATE PF 50 MCG/ML IJ SOSY: 25.0000 ug | PREFILLED_SYRINGE | INTRAMUSCULAR | Status: DC | PRN

## 2024-06-14 MED ORDER — MECLIZINE HCL 25 MG PO TABS
25.0000 mg | ORAL_TABLET | Freq: Three times a day (TID) | ORAL | Status: DC
  Administered 2024-06-14 – 2024-06-16 (×6): 25 mg via ORAL
  Filled 2024-06-14 (×6): qty 1

## 2024-06-14 MED ORDER — ROCURONIUM BROMIDE 100 MG/10ML IV SOLN
INTRAVENOUS | Status: DC | PRN
  Administered 2024-06-14: 40 mg via INTRAVENOUS

## 2024-06-14 MED ORDER — STERILE WATER FOR IRRIGATION IR SOLN
Status: DC | PRN
  Administered 2024-06-14: 2000 mL

## 2024-06-14 MED ORDER — ONDANSETRON HCL 4 MG/2ML IJ SOLN
INTRAMUSCULAR | Status: AC
  Filled 2024-06-14: qty 2

## 2024-06-14 MED ORDER — ENOXAPARIN SODIUM 30 MG/0.3ML IJ SOSY
30.0000 mg | PREFILLED_SYRINGE | INTRAMUSCULAR | Status: DC
  Administered 2024-06-15 – 2024-06-16 (×2): 30 mg via SUBCUTANEOUS
  Filled 2024-06-14 (×2): qty 0.3

## 2024-06-14 MED ORDER — ALBUMIN HUMAN 5 % IV SOLN: INTRAVENOUS | Status: DC | PRN

## 2024-06-14 MED ORDER — PHENOL 1.4 % MT LIQD: 1.0000 | OROMUCOSAL | Status: DC | PRN

## 2024-06-14 MED ORDER — OXYCODONE HCL 5 MG PO TABS: 5.0000 mg | ORAL_TABLET | Freq: Once | ORAL | Status: DC | PRN

## 2024-06-14 MED ORDER — SUGAMMADEX SODIUM 200 MG/2ML IV SOLN
INTRAVENOUS | Status: DC | PRN
  Administered 2024-06-14: 110 mg via INTRAVENOUS

## 2024-06-14 MED ORDER — ONDANSETRON 4 MG PO TBDP: 4.0000 mg | ORAL_TABLET | Freq: Three times a day (TID) | ORAL | 0 refills | Status: AC | PRN

## 2024-06-14 MED ORDER — TRAMADOL HCL 50 MG PO TABS: 50.0000 mg | ORAL_TABLET | Freq: Four times a day (QID) | ORAL | Status: DC | PRN

## 2024-06-14 MED ORDER — MAGNESIUM 250 MG PO TABS: 250.0000 mg | ORAL_TABLET | Freq: Every day | ORAL | Status: DC

## 2024-06-14 MED ORDER — METOCLOPRAMIDE HCL 5 MG/ML IJ SOLN: 5.0000 mg | Freq: Three times a day (TID) | INTRAMUSCULAR | Status: DC | PRN

## 2024-06-14 MED ORDER — GABAPENTIN 300 MG PO CAPS
600.0000 mg | ORAL_CAPSULE | Freq: Three times a day (TID) | ORAL | Status: DC
  Administered 2024-06-14 – 2024-06-16 (×6): 600 mg via ORAL
  Filled 2024-06-14 (×6): qty 2

## 2024-06-14 MED ORDER — DEXAMETHASONE SODIUM PHOSPHATE 10 MG/ML IJ SOLN
INTRAMUSCULAR | Status: AC
  Filled 2024-06-14: qty 1

## 2024-06-14 MED ORDER — FENTANYL CITRATE PF 50 MCG/ML IJ SOSY
50.0000 ug | PREFILLED_SYRINGE | Freq: Once | INTRAMUSCULAR | Status: AC
  Administered 2024-06-14: 50 ug via INTRAVENOUS
  Filled 2024-06-14: qty 2

## 2024-06-14 MED ORDER — DULOXETINE HCL 60 MG PO CPEP
60.0000 mg | ORAL_CAPSULE | Freq: Every day | ORAL | Status: DC
  Administered 2024-06-15 – 2024-06-16 (×2): 60 mg via ORAL
  Filled 2024-06-14 (×2): qty 1

## 2024-06-14 MED ORDER — SUGAMMADEX SODIUM 200 MG/2ML IV SOLN
INTRAVENOUS | Status: AC
  Filled 2024-06-14: qty 2

## 2024-06-14 MED ORDER — VITAMIN B-12 1000 MCG PO TABS
5000.0000 ug | ORAL_TABLET | Freq: Every day | ORAL | Status: DC
  Administered 2024-06-15 – 2024-06-16 (×2): 5000 ug via ORAL
  Filled 2024-06-14 (×2): qty 5

## 2024-06-14 MED ORDER — ONDANSETRON HCL 4 MG/2ML IJ SOLN: 4.0000 mg | Freq: Four times a day (QID) | INTRAMUSCULAR | Status: DC | PRN

## 2024-06-14 MED ORDER — PREDNISONE 5 MG PO TABS
5.0000 mg | ORAL_TABLET | Freq: Every day | ORAL | Status: DC
  Administered 2024-06-15 – 2024-06-16 (×2): 5 mg via ORAL
  Filled 2024-06-14 (×2): qty 1

## 2024-06-14 MED ORDER — TRANEXAMIC ACID-NACL 1000-0.7 MG/100ML-% IV SOLN
1000.0000 mg | Freq: Once | INTRAVENOUS | Status: AC
  Administered 2024-06-14: 1000 mg via INTRAVENOUS
  Filled 2024-06-14: qty 100

## 2024-06-14 MED ORDER — BUPIVACAINE LIPOSOME 1.3 % IJ SUSP
INTRAMUSCULAR | Status: DC | PRN
Start: 2024-06-14 — End: 2024-06-14
  Administered 2024-06-14: 10 mL via PERINEURAL

## 2024-06-14 MED ORDER — ACETAMINOPHEN 500 MG PO TABS
500.0000 mg | ORAL_TABLET | Freq: Four times a day (QID) | ORAL | Status: AC
  Administered 2024-06-14 – 2024-06-15 (×2): 500 mg via ORAL
  Filled 2024-06-14 (×2): qty 1

## 2024-06-14 SURGICAL SUPPLY — 55 items
BAG COUNTER SPONGE SURGICOUNT (BAG) IMPLANT
BAG ZIPLOCK 12X15 (MISCELLANEOUS) ×1 IMPLANT
BIT DRILL 2.7 W/STOP DISP (BIT) IMPLANT
BIT DRILL TWIST 2.7 (BIT) IMPLANT
BLADE SAG 18X100X1.27 (BLADE) ×1 IMPLANT
CEMENT BONE DEPUY (Cement) IMPLANT
COMP HUM UNI IDENTI -6 EXT (Joint) IMPLANT
COMP HUM UNI IDENTI 36 +0 (Joint) IMPLANT
COMPONENT RV AUG LG W/TAPR/GLN (Joint) IMPLANT
COOLER ICEMAN CLASSIC (MISCELLANEOUS) ×1 IMPLANT
COVER BACK TABLE 60X90IN (DRAPES) ×1 IMPLANT
COVER SURGICAL LIGHT HANDLE (MISCELLANEOUS) ×1 IMPLANT
DRAPE POUCH INSTRU U-SHP 10X18 (DRAPES) ×1 IMPLANT
DRAPE SHEET LG 3/4 BI-LAMINATE (DRAPES) ×1 IMPLANT
DRAPE SURG 17X11 SM STRL (DRAPES) ×1 IMPLANT
DRAPE SURG ORHT 6 SPLT 77X108 (DRAPES) ×2 IMPLANT
DRAPE TOP 10253 STERILE (DRAPES) ×1 IMPLANT
DRAPE U-SHAPE 47X51 STRL (DRAPES) ×1 IMPLANT
DRSG AQUACEL AG ADV 3.5X 6 (GAUZE/BANDAGES/DRESSINGS) IMPLANT
DRSG AQUACEL AG ADV 3.5X10 (GAUZE/BANDAGES/DRESSINGS) IMPLANT
DURAPREP 26ML APPLICATOR (WOUND CARE) ×1 IMPLANT
ELECT PENCIL ROCKER SW 15FT (MISCELLANEOUS) ×1 IMPLANT
ELECT REM PT RETURN 15FT ADLT (MISCELLANEOUS) ×1 IMPLANT
FACESHIELD WRAPAROUND OR TEAM (MASK) ×1 IMPLANT
GLENOID SPHERE 36MM CVD +3 (Orthopedic Implant) IMPLANT
GLOVE BIO SURGEON STRL SZ7.5 (GLOVE) ×4 IMPLANT
GLOVE BIOGEL PI IND STRL 8 (GLOVE) ×2 IMPLANT
GOWN STRL REUS W/ TWL XL LVL3 (GOWN DISPOSABLE) ×2 IMPLANT
KIT BASIN OR (CUSTOM PROCEDURE TRAY) ×1 IMPLANT
KIT TURNOVER KIT A (KITS) ×1 IMPLANT
MANIFOLD NEPTUNE II (INSTRUMENTS) ×1 IMPLANT
NDL TAPERED W/ NITINOL LOOP (MISCELLANEOUS) IMPLANT
NEEDLE TAPERED W/ NITINOL LOOP (MISCELLANEOUS) IMPLANT
NS IRRIG 1000ML POUR BTL (IV SOLUTION) ×1 IMPLANT
PACK SHOULDER (CUSTOM PROCEDURE TRAY) ×1 IMPLANT
PAD COLD SHLDR WRAP-ON (PAD) ×1 IMPLANT
PIN THREADED REVERSE (PIN) IMPLANT
REAMER GUIDE BUSHING SURG DISP (MISCELLANEOUS) IMPLANT
REAMER GUIDE W/SCREW AUG (MISCELLANEOUS) IMPLANT
RESTRAINT HEAD UNIVERSAL NS (MISCELLANEOUS) ×1 IMPLANT
SCREW BONE LOCKING 4.75X30X3.5 (Screw) IMPLANT
SCREW BONE STRL 6.5MMX25MM (Screw) IMPLANT
SCREW LOCKING 4.75MMX15MM (Screw) IMPLANT
SLING ARM IMMOBILIZER MED (SOFTGOODS) ×1 IMPLANT
STEM HUMERAL MICRO SZ10 (Stem) IMPLANT
STRIP CLOSURE SKIN 1/2X4 (GAUZE/BANDAGES/DRESSINGS) ×1 IMPLANT
SUT MNCRL AB 3-0 PS2 18 (SUTURE) ×1 IMPLANT
SUT MON AB 2-0 CT1 36 (SUTURE) ×1 IMPLANT
SUT VIC AB 0 CT1 36 (SUTURE) ×1 IMPLANT
SUT VIC AB 1 CT1 36 (SUTURE) ×1 IMPLANT
SUTURE FIBERWR #2 38 T-5 BLUE (SUTURE) IMPLANT
SUTURE TAPE 1.3 40 TPR END (SUTURE) ×1 IMPLANT
TOWEL OR 17X26 10 PK STRL BLUE (TOWEL DISPOSABLE) ×1 IMPLANT
TOWER SMARTMIX MINI (MISCELLANEOUS) IMPLANT
TUBE SUCTION HIGH CAP CLEAR NV (SUCTIONS) ×1 IMPLANT

## 2024-06-14 NOTE — Anesthesia Preprocedure Evaluation (Addendum)
 Anesthesia Evaluation  Patient identified by MRN, date of birth, ID band Patient awake    Reviewed: Allergy & Precautions, H&P , NPO status , Patient's Chart, lab work & pertinent test results  History of Anesthesia Complications (+) PONV and history of anesthetic complications  Airway Mallampati: II  TM Distance: >3 FB Neck ROM: Full    Dental  (+) Partial Lower, Partial Upper   Pulmonary neg pulmonary ROS   Pulmonary exam normal breath sounds clear to auscultation       Cardiovascular hypertension, + DVT  Normal cardiovascular exam Rhythm:Regular Rate:Normal     Neuro/Psych   Anxiety     negative neurological ROS     GI/Hepatic Neg liver ROS,GERD  ,,  Endo/Other  negative endocrine ROS    Renal/GU Renal InsufficiencyRenal disease   Left breast cancer    Musculoskeletal  (+) Arthritis ,  Fibromyalgia -  Abdominal   Peds negative pediatric ROS (+)  Hematology  (+) Blood dyscrasia, anemia   Anesthesia Other Findings   Reproductive/Obstetrics negative OB ROS                              Anesthesia Physical Anesthesia Plan  ASA: 3  Anesthesia Plan: General and Regional   Post-op Pain Management: Ofirmev  IV (intra-op)* and Regional block*   Induction: Intravenous  PONV Risk Score and Plan: 4 or greater and Ondansetron , Dexamethasone  and Treatment may vary due to age or medical condition  Airway Management Planned: Oral ETT  Additional Equipment: None  Intra-op Plan:   Post-operative Plan: Extubation in OR  Informed Consent: I have reviewed the patients History and Physical, chart, labs and discussed the procedure including the risks, benefits and alternatives for the proposed anesthesia with the patient or authorized representative who has indicated his/her understanding and acceptance.     Dental advisory given  Plan Discussed with: CRNA  Anesthesia Plan Comments:           Anesthesia Quick Evaluation

## 2024-06-14 NOTE — Anesthesia Postprocedure Evaluation (Signed)
 Anesthesia Post Note  Patient: Teresa Kidd  Procedure(s) Performed: ARTHROPLASTY, SHOULDER, TOTAL, REVERSE (Right: Shoulder)     Patient location during evaluation: PACU Anesthesia Type: Regional and General Level of consciousness: awake and alert Pain management: pain level controlled Vital Signs Assessment: post-procedure vital signs reviewed and stable Respiratory status: spontaneous breathing, nonlabored ventilation, respiratory function stable and patient connected to nasal cannula oxygen Cardiovascular status: blood pressure returned to baseline and stable Postop Assessment: no apparent nausea or vomiting Anesthetic complications: no   No notable events documented.  Last Vitals:  Vitals:   06/14/24 1245 06/14/24 1300  BP: (!) 176/77 (!) 175/77  Pulse: (!) 58 (!) 58  Resp: 17 15  Temp:  (!) 36.4 C  SpO2: 100% 100%    Last Pain:  Vitals:   06/14/24 1300  TempSrc:   PainSc: 0-No pain                 Thom JONELLE Peoples

## 2024-06-14 NOTE — Progress Notes (Signed)
 PHARMACY NOTE:  ANTIMICROBIAL RENAL DOSAGE ADJUSTMENT  Current antimicrobial regimen includes a mismatch between antimicrobial dosage and estimated renal function.  As per policy approved by the Pharmacy & Therapeutics and Medical Executive Committees, the antimicrobial dosage will be adjusted accordingly.  Current antimicrobial dosage:  cefazolin  2gm  q6h x2 doses   Indication: post-surgical pxx  Renal Function:  Estimated Creatinine Clearance: 25.5 mL/min (A) (by C-G formula based on SCr of 1.2 mg/dL (H)). []      On intermittent HD, scheduled: []      On CRRT    Antimicrobial dosage has been changed to:  cefazolin  2gm IV x1 at 10p (12 hrs after pre-op dose)     Thank you for allowing pharmacy to be a part of this patient's care.  Osie Iantha SQUIBB, [facility] 06/14/2024 1:15 PM

## 2024-06-14 NOTE — Brief Op Note (Signed)
 06/14/2024  11:44 AM  PATIENT:  Teresa Kidd  83 y.o. female  PRE-OPERATIVE DIAGNOSIS:  RIGHT SHOULDER OSTEOARTHRITIS  POST-OPERATIVE DIAGNOSIS:  RIGHT SHOULDER OSTEOARTHRITIS  PROCEDURE:  Procedure(s) with comments: ARTHROPLASTY, SHOULDER, TOTAL, REVERSE (Right) - please put knee to follow this case  SURGEON:  Surgeons and Role:    * Sharl Selinda Dover, MD - Primary  PHYSICIAN ASSISTANT: Dayle Moores, PA-C  ANESTHESIA:   regional and general  EBL:  150 mL   BLOOD ADMINISTERED:none  DRAINS: none   LOCAL MEDICATIONS USED:  NONE  SPECIMEN:  No Specimen  DISPOSITION OF SPECIMEN:  N/A  COUNTS:  YES  TOURNIQUET:  * No tourniquets in log *  DICTATION: .Note written in EPIC  PLAN OF CARE: Admit for overnight observation  PATIENT DISPOSITION:  PACU - hemodynamically stable.   Delay start of Pharmacological VTE agent (>24hrs) due to surgical blood loss or risk of bleeding: not applicable

## 2024-06-14 NOTE — Transfer of Care (Signed)
 Immediate Anesthesia Transfer of Care Note  Patient: Teresa Kidd  Procedure(s) Performed: ARTHROPLASTY, SHOULDER, TOTAL, REVERSE (Right: Shoulder)  Patient Location: PACU  Anesthesia Type:GA combined with regional for post-op pain  Level of Consciousness: drowsy  Airway & Oxygen Therapy: Patient Spontanous Breathing and Patient connected to nasal cannula oxygen  Post-op Assessment: Report given to RN and Post -op Vital signs reviewed and stable  Post vital signs: Reviewed and stable  Last Vitals:  Vitals Value Taken Time  BP 174/80 06/14/24 11:51  Temp    Pulse 62 06/14/24 11:54  Resp 16 06/14/24 11:54  SpO2 100 % 06/14/24 11:54  Vitals shown include unfiled device data.  Last Pain:  Vitals:   06/14/24 0913  TempSrc:   PainSc: 0-No pain         Complications: No notable events documented.

## 2024-06-14 NOTE — Op Note (Signed)
 06/14/2024  11:51 AM  PATIENT:  Teresa Kidd    PRE-OPERATIVE DIAGNOSIS: Right shoulder rotator cuff arthropathy  POST-OPERATIVE DIAGNOSIS:  Same  PROCEDURE:  Right ARTHROPLASTY, SHOULDER, TOTAL, REVERSE  SURGEON:  Selinda Belvie Gosling, MD  ASSISTANT: Dayle Moores, PA-C  Assistant attestation:  PA Mcclung scrubbed and present for the entire procedure.  ANESTHESIA:   General With Exparel  interscalene  ESTIMATED BLOOD LOSS: 150 cc  PREOPERATIVE INDICATIONS:  Teresa Kidd is a  83 y.o. female with a diagnosis of RIGHT SHOULDER ROTATOR CUFF ARTHROPATHY OSTEOARTHRITIS who failed conservative measures and elected for surgical management.    The risks benefits and alternatives were discussed with the patient preoperatively including but not limited to the risks of infection, bleeding, nerve injury, cardiopulmonary complications, the need for revision surgery, dislocation, brachial plexus palsy, incomplete relief of pain, among others, and the patient was willing to proceed.  OPERATIVE IMPLANTS:  Zimmer micro identity stem size 10 with a extra extended tray and a standard flat 135 degree liner A large augment 25 mm baseplate with a 25 mm central screw and 4 peripheral locking screws Size 36+3 mm lateralized glenosphere   OPERATIVE FINDINGS: Advanced rotator cuff arthropathy with advanced degenerative joint disease on the glenohumeral joint with complete loss of articular cartilage.  Deficiency noted of the supraspinatus, infraspinatus as well as biceps tendon.  The upper border of the subscapularis was torn but lower intact.  Teres minor intact.  OPERATIVE PROCEDURE: The patient was brought to the operating room and placed in the supine position. General anesthesia was administered. IV antibiotics were given. A Foley was not placed. Time out was performed. The upper extremity was prepped and draped in usual sterile fashion. The patient was in a beachchair position. Deltopectoral approach  was carried out. The biceps was released. The subscapularis was released off of the bone.   I then performed circumferential releases of the humerus, and then dislocated the head, and then reamed with the reamer to the above named size.  I then applied the jig, and cut the humeral head in 30 of retroversion, and then turned my attention to the glenoid.  Deep retractors were placed, and I resected the labrum, and then placed a guidepin into the center position on the glenoid, with slight inferior inclination.  Next, we trialed with a superior augment medium and large.  The large fit very nicely.  So we did a secondary ream for the large augment oriented at about the 10 o'clock position in this right shoulder.  I then reamed over the guidepin, and this created a small metaphyseal cancellus blush inferiorly, removing just the cartilage to the subchondral bone superiorly. The base plate was selected and impacted place, and then I secured it centrally with a nonlocking screw, and I had excellent purchase both inferiorly and superiorly. I placed a short locking screws on anterior and posterior aspects.  I then turned my attention to the glenosphere, and impacted this into place, placing slight inferior offset (set on A).   The glenoid sphere was completely seated, and had engagement of the [facility] taper. I then turned my attention back to the humerus.  I sequentially broached, and then trialed, and was found to restore soft tissue tension, and it had 2 finger tightness. Therefore the above named components were selected. The shoulder felt stable throughout functional motion.   I then impacted the real prosthesis into place, as well as the real humeral tray, and reduced the shoulder. The shoulder had  excellent motion, and was stable, and I irrigated the wounds copiously.   We did sew the subscapularis back to the lesser tuberosity with drill holes through the tuberosity.  I then irrigated the shoulder  copiously once more, placed 1 g of vancomycin  powder into the wound , repaired the deltopectoral interval with #2 FiberWire followed by subcutaneous Vicryl, then monocryl for the skin,  with Steri-Strips and sterile gauze for the skin. The patient was awakened and returned back in stable and satisfactory condition. There no complications and they tolerated the procedure well.  All counts were correct x2.  Disposition:  Teresa Kidd will be admitted for observation.  Anticipate discharge home tomorrow.  She will be in her sling for comfort only.  She may begin using the arm immediately for activities of daily living.  Will see her back in the office in 2 weeks for 2 x-ray views right shoulder AP and Scap Y.

## 2024-06-14 NOTE — Anesthesia Procedure Notes (Signed)
 Procedure Name: Intubation Date/Time: 06/14/2024 10:15 AM  Performed by: Landy Chip HERO, CRNAPre-anesthesia Checklist: Patient identified, Emergency Drugs available, Suction available and Patient being monitored Patient Re-evaluated:Patient Re-evaluated prior to induction Oxygen Delivery Method: Circle System Utilized Preoxygenation: Pre-oxygenation with 100% oxygen Induction Type: IV induction Ventilation: Mask ventilation without difficulty Laryngoscope Size: Mac and 4 Grade View: Grade II Tube type: Oral Tube size: 7.0 mm Number of attempts: 1 Airway Equipment and Method: Stylet Placement Confirmation: ETT inserted through vocal cords under direct vision, positive ETCO2 and breath sounds checked- equal and bilateral Secured at: 20 (teeth @ 20, lip @ 21) cm Tube secured with: Tape Dental Injury: Teeth and Oropharynx as per pre-operative assessment

## 2024-06-14 NOTE — Discharge Instructions (Signed)
 Orthopedic surgery discharge instructions:  -Maintain postoperative bandage until follow-up appointment.  This is waterproof, and you may begin showering on postoperative day #3.  Do not submerge underwater.  Maintain that bandage until your follow-up appointment in 2 weeks.  -No lifting over 2 pounds with operateive arm.  You may use the arm immediately for activities of daily living such as bathing, washing your face and brushing your teeth, eating, and getting dressed.  Otherwise maintain your sling when you are out of the house and sleeping.  -Apply ice liberally to the shoulder throughout the day.  For mild to moderate pain use Tylenol and Advil as needed around-the-clock.  For breakthrough pain use oxycodone as necessary.  -You will return to see Dr. Aundria Rud in the office in 2 weeks for routine postoperative check with x-rays.

## 2024-06-14 NOTE — H&P (Signed)
 ORTHOPAEDIC H&P  REQUESTING PHYSICIAN: Sharl Selinda Dover, MD  PCP:  Debarah Casandra BRAVO, DO  Chief Complaint: Right shoulder osteoarthritis,  HPI: Teresa Kidd is a 83 y.o. female who complains of worsening pain and dysfunction.  Here for reverse arthroplasty.  No new complaints.   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   Dehydration    with AKI   DVT (deep venous thrombosis) (HCC) 01/18/2016   left leg   Family history of adverse reaction to anesthesia    Fibromyalgia    GERD (gastroesophageal reflux disease)    History of kidney stones 2006-2007   Hypertension    Hypothyroidism    PONV (postoperative nausea and vomiting)    motion sickness   Pre-diabetes    Past Surgical History:  Procedure Laterality Date   APPENDECTOMY     BACK SURGERY  1994   herniated disc, DJD , Stabalizers , Spinal stenosis surgery   BREAST SURGERY     lumpectomy left breast   CARPAL TUNNEL RELEASE  1985   CHOLECYSTECTOMY  1976   COLONOSCOPY     polyps removed   CYSTOCELE REPAIR N/A 08/22/2017   Procedure: ANTERIOR (CYSTOCELE) WITH GRAFT;  Surgeon: Gaston Hamilton, MD;  Location: [facility];  Service: Urology;  Laterality: N/A;   CYSTOSCOPY N/A 08/22/2017   Procedure: CYSTOSCOPY;  Surgeon: Gaston Hamilton, MD;  Location: [facility];  Service: Urology;  Laterality: N/A;   DILATATION & CURETTAGE/HYSTEROSCOPY WITH MYOSURE N/A 10/24/2016   Procedure: DILATATION & CURETTAGE/HYSTEROSCOPY WITH MYOSURE;  Surgeon: Dickie Carder, MD;  Location: [facility];  Service: Gynecology;  Laterality: N/A;   REVERSE SHOULDER ARTHROPLASTY Left 08/13/2021   Procedure: REVERSE SHOULDER ARTHROPLASTY;  Surgeon: Sharl Selinda Dover, MD;  Location: [facility] OR;  Service: Orthopedics;  Laterality: Left;   ROTATOR CUFF REPAIR     SALPINGOOPHORECTOMY Bilateral 08/22/2017   Procedure: BILATERAL SALPINGO OOPHORECTOMY;   Surgeon: Carder Dickie, MD;  Location: [facility];  Service: Gynecology;  Laterality: Bilateral;   THYROIDECTOMY  1985   TOTAL HIP ARTHROPLASTY Right 02/23/2021   Procedure: TOTAL HIP ARTHROPLASTY ANTERIOR APPROACH;  Surgeon: Ernie Cough, MD;  Location: [facility];  Service: Orthopedics;  Laterality: Right;   VAGINAL HYSTERECTOMY N/A 08/22/2017   Procedure: TOTAL HYSTERECTOMY VAGINAL;  Surgeon: Carder Dickie, MD;  Location: [facility];  Service: Gynecology;  Laterality: N/A;   VAGINAL PROLAPSE REPAIR N/A 08/22/2017   Procedure: VAGINAL VAULT PROLAPSE REPAIR;  Surgeon: Gaston Hamilton, MD;  Location: [facility];  Service: Urology;  Laterality: N/A;   Social History   Socioeconomic History   Marital status: Widowed    Spouse name: Not on file   Number of children: Not on file   Years of education: Not on file   Highest education level: Not on file  Occupational History   Not on file  Tobacco Use   Smoking status: Never   Smokeless tobacco: Never  Vaping Use   Vaping status: Never Used  Substance and Sexual Activity   Alcohol  use: No   Drug use: No   Sexual activity: Not Currently  Other Topics Concern   Not on file  Social History Narrative   Not on file   Social Drivers of Health   Financial Resource Strain: Not on file  Food Insecurity: Not on file  Transportation Needs:  Not on file  Physical Activity: Not on file  Stress: Not on file  Social Connections: Not on file   No family history on file. Allergies  Allergen Reactions   Feldene [Piroxicam] Itching    In palm of hands & bottom of feets   Prior to Admission medications   Medication Sig Start Date End Date Taking? Authorizing Provider  acetaminophen  (TYLENOL ) 500 MG tablet Take 500-1,000 mg by mouth every 6 (six) hours as needed for moderate pain (pain score 4-6).   Yes [provider]  Cyanocobalamin (B-12) 5000 MCG CAPS Take 5,000 mcg by mouth daily.   Yes [provider]  DULoxetine   (CYMBALTA ) 30 MG capsule Take 30 mg by mouth daily.   Yes [provider]  DULoxetine  (CYMBALTA ) 60 MG capsule Take 60 mg by mouth daily. 05/22/24  Yes [provider]  folic acid  (FOLVITE ) 1 MG tablet Take 1 mg by mouth daily. 04/18/24  Yes [provider]  gabapentin  (NEURONTIN ) 600 MG tablet Take 600 mg by mouth 3 (three) times daily. 05/21/24  Yes [provider]  hydroxychloroquine  (PLAQUENIL ) 200 MG tablet Take 200 mg by mouth daily.   Yes [provider]  levothyroxine  (SYNTHROID , LEVOTHROID) 75 MCG tablet Take 75 mcg by mouth daily before breakfast. 09/15/16  Yes [provider]  lisinopril  (ZESTRIL ) 10 MG tablet Take 10 mg by mouth daily. 04/18/24  Yes [provider]  Magnesium  250 MG TABS Take 250 mg by mouth daily.   Yes [provider]  meclizine  (ANTIVERT ) 12.5 MG tablet Take 25 mg by mouth 3 (three) times daily. 05/07/24  Yes [provider]  omeprazole (PRILOSEC) 20 MG capsule Take 40 mg by mouth daily. 09/21/16  Yes [provider]  potassium chloride  SA (KLOR-CON  M) 20 MEQ tablet Take 20 mEq by mouth 2 (two) times daily. 04/18/24  Yes [provider]  predniSONE  (DELTASONE ) 5 MG tablet Take 5 mg by mouth daily with breakfast.   Yes [provider]  rosuvastatin  (CRESTOR ) 10 MG tablet Take 10 mg by mouth at bedtime. 04/18/24  Yes [provider]   No results found.  Positive ROS: All other systems have been reviewed and were otherwise negative with the exception of those mentioned in the HPI and as above.  Physical Exam: General: Alert, no acute distress Cardiovascular: No pedal edema Respiratory: No cyanosis, no use of accessory musculature GI: No organomegaly, abdomen is soft and non-tender Skin: No lesions in the area of chief complaint Neurologic: Sensation intact distally Psychiatric: Patient is competent for consent with normal mood and affect Lymphatic: No  axillary or cervical lymphadenopathy  MUSCULOSKELETAL:  RUE-  wwp, nvi  Assessment: Right shoulder end stage osteoarthritis  Plan: 2 OR today for reverse arthroplasty.  Admit post op for overnight obs  The risks, benefits, and alternatives were discussed with the patient. There are risks associated with the surgery including, but not limited to, problems with anesthesia (death), infection, differences in leg length/angulation/rotation, fracture of bones, loosening or failure of implants, malunion, nonunion, hematoma (blood accumulation) which may require surgical drainage, blood clots, pulmonary embolism, nerve injury (foot drop), and blood vessel injury. The patient understands these risks and elects to proceed.     Selinda Belvie Gosling, MD Cell [phone]    06/14/2024 7:26 AM

## 2024-06-14 NOTE — Anesthesia Procedure Notes (Signed)
 Anesthesia Regional Block: Interscalene brachial plexus block   Pre-Anesthetic Checklist: , timeout performed,  Correct Patient, Correct Site, Correct Laterality,  Correct Procedure, Correct Position, site marked,  Risks and benefits discussed,  Surgical consent,  Pre-op evaluation,  At surgeon's request and post-op pain management  Laterality: Right  Prep: chloraprep       Needles:  Injection technique: Single-shot  Needle Type: Echogenic Stimulator Needle     Needle Length: 9cm  Needle Gauge: 21     Additional Needles:   Procedures:,,,, ultrasound used (permanent image in chart),,    Narrative:  Start time: 06/14/2024 9:00 AM End time: 06/14/2024 9:05 AM Injection made incrementally with aspirations every 5 mL.  Performed by: Personally  Anesthesiologist: Erma Thom SAUNDERS, MD  Additional Notes: Discussed risks and benefits of the nerve block in detail, including but not limited vascular injury, permanent nerve damage and infection.   Patient tolerated the procedure well. Local anesthetic introduced in an incremental fashion under minimal resistance after negative aspirations. No paresthesias were elicited. After completion of the procedure, no acute issues were identified and patient continued to be monitored by RN.

## 2024-06-15 ENCOUNTER — Other Ambulatory Visit (HOSPITAL_COMMUNITY): Payer: Self-pay

## 2024-06-15 DIAGNOSIS — M19011 Primary osteoarthritis, right shoulder: Secondary | ICD-10-CM | POA: Diagnosis not present

## 2024-06-15 NOTE — Care Management Obs Status (Signed)
 MEDICARE OBSERVATION STATUS NOTIFICATION   Patient Details  Name: Teresa Kidd MRN: [id number] Date of Birth: 09-26-1941   Medicare Observation Status Notification Given:  Yes    Doneta Glenys DASEN, RN 06/15/2024, 10:11 AM

## 2024-06-15 NOTE — Plan of Care (Signed)
  Problem: Elimination: Goal: Will not experience complications related to urinary retention Outcome: Progressing   Problem: Safety: Goal: Ability to remain free from injury will improve Outcome: Progressing   Problem: Education: Goal: Knowledge of the prescribed therapeutic regimen will improve Outcome: Progressing   Problem: Pain Management: Goal: Pain level will decrease with appropriate interventions Outcome: Progressing

## 2024-06-15 NOTE — TOC Progression Note (Signed)
 Transition of Care [facility]) - Progression Note    Patient Details  Name: Teresa Kidd MRN: [id number] Date of Birth: [birth year]  [facility] CM/SW Contact  Doneta Glenys DASEN, RN Phone Number: 06/15/2024, 10:17 AM  Clinical Narrative:    MOON completed.  Expected Discharge Plan and Services         Expected Discharge Date: 06/15/24                                     Social Drivers of Health (SDOH) Interventions SDOH Screenings   Food Insecurity: No Food Insecurity (06/14/2024)  Housing: Low Risk  (06/14/2024)  Transportation Needs: No Transportation Needs (06/14/2024)  Utilities: Not At Risk (06/14/2024)  Social Connections: Socially Isolated (06/14/2024)  Tobacco Use: Low Risk  (06/14/2024)    Readmission Risk Interventions     No data to display

## 2024-06-15 NOTE — Progress Notes (Addendum)
 Subjective: 1 Day Post-Op Procedure(s) (LRB): ARTHROPLASTY, SHOULDER, TOTAL, REVERSE (Right)  Patient reports pain as appropriately controlled. Denies any new numbness/tingling.   Objective:   VITALS:  Temp:  [97 F (36.1 C)-98.8 F (37.1 C)] 98.2 F (36.8 C) (08/09 0524) Pulse Rate:  [55-69] 62 (08/09 0524) Resp:  [10-20] 18 (08/09 0524) BP: (120-197)/(61-99) 122/66 (08/09 0524) SpO2:  [93 %-100 %] 100 % (08/09 0524)  Neurovascular intact Sensation intact distally Intact pulses distally Dorsiflexion/Plantar flexion intact Incision: dressing C/D/I No cellulitis present Compartment soft   LABS Recent Labs    06/14/24 1415  HGB 9.9*  WBC 9.0  PLT 156   Recent Labs    06/14/24 1415  CREATININE 1.27*   No results for input(s): LABPT, INR in the last 72 hours.   Assessment/Plan: 1 Day Post-Op Procedure(s) (LRB): ARTHROPLASTY, SHOULDER, TOTAL, REVERSE (Right)  Advance diet Up with therapy D/C IV fluids Plan for discharge today  Teresa Kidd 06/15/2024, 8:33 AM

## 2024-06-15 NOTE — Evaluation (Signed)
 Occupational Therapy Evaluation Patient Details Name: Teresa Kidd MRN: [id number] DOB: 20-Jun-1941 Today's Date: 06/15/2024   History of Present Illness   Patient is 83 y.o. female s/p R Reverse TSA on 06/14/24 PMH: Rt THA anterior HTN, hypothyroidism, GERD, OA, DVT, breast cancer.     Clinical Impressions PTA pt lives with family and was mod I for basic transfers, RW use and ADL's in home but assist for community mobility.  No caregiver present this session for typical post op discharge education and training with patient reporting grand daughter may visit later but she is unable to leave the hospital today as OT was trying to coordinate for discharge education.  Nursing contacted and MD to be notified. OT provided initial education completed regarding compensatory strategies for ADL tasks and functional mobility, management of sling, R  ROM per specified parameters in the order set as indicated below, positioning of operative arm in sitting and supine and edema control, including use of Iceman Cold Therapy machine. No caregiver was present despite efforts to arrange for discharge with nursing and patient.OT introduced  written handouts provided and reviewed using Teach Back and pt verbalized/demonstrated understanding with written handout and cues. Due to the below listed deficits, pt requires minimal  assistance with ADL tasks and minimal  assist with functional mobility. OT care coordination this day for caregiver arrival.  Pt to follow up with MD to progress rehab of the operative shoulder. Patient will benefit from continued Acute care hospital level OT services to progress safety and functional performance and allow for discharge.       If plan is discharge home, recommend the following:   A little help with walking and/or transfers;A little help with bathing/dressing/bathroom;Direct supervision/assist for medications management;Direct supervision/assist for financial management;Assist for  transportation;Help with stairs or ramp for entrance;Supervision due to cognitive status     Functional Status Assessment   Patient has had a recent decline in their functional status and demonstrates the ability to make significant improvements in function in a reasonable and predictable amount of time.     Equipment Recommendations   None recommended by OT      Precautions/Restrictions   Precautions Precautions: Shoulder Precaution Booklet Issued: Yes (comment) Recall of Precautions/Restrictions: Impaired Required Braces or Orthoses: Sling Restrictions Weight Bearing Restrictions Per Provider Order: Yes RUE Weight Bearing Per Provider Order: Non weight bearing RUE Partial Weight Bearing Percentage or Pounds: Per ortho PA  she can use it for basic adls , elbow , wrist , digit exercises , no lifting more than 1-2 pounds  but when not doing that will stay in the sling     Mobility Bed Mobility Overal bed mobility: Needs Assistance Bed Mobility: Supine to Sit, Sit to Supine     Supine to sit: Supervision Sit to supine: Supervision   General bed mobility comments: HOB elevated but no physical assist    Transfers Overall transfer level: Needs assistance Equipment used: Rolling walker (2 wheels) Transfers: Sit to/from Stand, Bed to chair/wheelchair/BSC Sit to Stand: Supervision     Step pivot transfers: Supervision     General transfer comment: ambulated with RW with light use of R UE on RW and supervision to and from bathroom and into recliner      Balance Overall balance assessment: Mild deficits observed, not formally tested  ADL either performed or assessed with clinical judgement   ADL Overall ADL's : Needs assistance/impaired    Per orders, R shoulder parameters as follows for ADL tasks:  Per ortho PA  she can use it for basic adls , elbow , wrist , digit exercises , no lifting more than 1-2  pounds   but when not doing that will stay in the sling. While moving within specified parameters, pt  instructed on bathing and how to donn/doff shirt, placing operative arm through sleeve first when donning and off last when doffing .Pt educated on compensatory strategies for LB ADL and strategies to reduce risk of falls.  Pt educated on donning/doffing sling and to wear the sling at all times with the exception of ADL, and to loosen the neck strap of the sling when the operative arm is in a supported position when sitting. In sitting or supine, pt instructed to have a pillow behind and under their operative arm to provide support.Use of gait belt and RW for amb this session.   Education regarding use of IceMan Cold Therapy, including the importance of using a barrier on the shoulder prior to positioning the wrap-on pad. Pt verbalized/demonstrated understanding but will need caregiver training by therapy or nursing for discharge.                                           Vision Baseline Vision/History: 1 Wears glasses;0 No visual deficits Ability to See in Adequate Light: 0 Adequate Patient Visual Report: No change from baseline Vision Assessment?: No apparent visual deficits;Wears glasses for reading            Pertinent Vitals/Pain Pain Assessment Pain Assessment: Faces Faces Pain Scale: No hurt Pain Intervention(s): Premedicated before session, Monitored during session, Repositioned, Ice applied     Extremity/Trunk Assessment Upper Extremity Assessment Upper Extremity Assessment: Right hand dominant;RUE deficits/detail RUE Deficits / Details: R UE nerve block still somewhat active but distal R UE elbow, wrist and hand WFL's RUE Coordination: decreased fine motor   Lower Extremity Assessment Lower Extremity Assessment: Overall WFL for tasks assessed   Cervical / Trunk Assessment Cervical / Trunk Assessment: Normal   Communication Communication Communication: No  apparent difficulties   Cognition Arousal: Alert Behavior During Therapy: Anxious Cognition: History of cognitive impairments             OT - Cognition Comments: mild STM deficits                 Following commands: Intact       Cueing  General Comments   Cueing Techniques: Verbal cues  R post op shoulder dressing intact   Exercises Exercises: Shoulder Shoulder Exercises Elbow Flexion: AAROM, Right, 10 reps Elbow Extension: AAROM, 10 reps, Right Wrist Flexion: AAROM, Right, 10 reps Wrist Extension: AAROM, 10 reps, Right Digit Composite Flexion: AAROM, Right, 10 reps Composite Extension: AAROM, Right, 10 reps   Shoulder Instructions Shoulder Instructions Donning/doffing shirt without moving shoulder: Minimal assistance Method for sponge bathing under operated UE: Minimal assistance Donning/doffing sling/immobilizer: Minimal assistance Correct positioning of sling/immobilizer: Minimal assistance ROM for elbow, wrist and digits of operated UE: Minimal assistance Sling wearing schedule (on at all times/off for ADL's): Minimal assistance Proper positioning of operated UE when showering: Minimal assistance Positioning of UE while sleeping: Minimal assistance    Home Living Family/patient expects to be discharged to::  Private residence Living Arrangements: Children;Other relatives Available Help at Discharge: Available 24 hours/day;Family Type of Home: Mobile home Home Access: Ramped entrance     Home Layout: One level     Bathroom Shower/Tub: Walk-in shower;Door   [employer] Toilet: Standard Bathroom Accessibility: Yes How Accessible: Accessible via walker Home Equipment: Agricultural consultant (2 wheels);Cane - single point;Wheelchair - manual;Hand held shower head;Adaptive equipment;Grab bars - tub/shower;BSC/3in1;Hospital bed Adaptive Equipment: Reacher Additional Comments: daughter and grand son      Prior Functioning/Environment Prior Level of Function :  Needs assist       Physical Assist : Mobility (physical);ADLs (physical) Mobility (physical): Gait ADLs (physical): IADLs;Bathing Mobility Comments: fall risk ADLs Comments: shower and IADL assist    OT Problem List: Decreased range of motion;Decreased activity tolerance;Decreased strength;Impaired balance (sitting and/or standing);Decreased coordination;Decreased cognition;Decreased safety awareness;Decreased knowledge of use of DME or AE;Decreased knowledge of precautions;Impaired UE functional use   OT Treatment/Interventions: Self-care/ADL training;Therapeutic exercise;Energy conservation;DME and/or AE instruction;Neuromuscular education;Manual therapy;Modalities;Therapeutic activities;Cognitive remediation/compensation;Patient/family education;Balance training      OT Goals(Current goals can be found in the care plan section)   Acute Rehab OT Goals Patient Stated Goal: to use my arm better when I can OT Goal Formulation: With patient Time For Goal Achievement: 06/29/24 Potential to Achieve Goals: Good ADL Goals Pt Will Perform Upper Body Bathing: with contact guard assist;sitting Pt Will Perform Lower Body Bathing: with min assist;sit to/from stand Pt Will Perform Upper Body Dressing: with min assist;sitting Pt Will Perform Lower Body Dressing: with min assist;sit to/from stand Pt Will Transfer to Toilet: with supervision;ambulating;bedside commode Additional ADL Goal #1: Patient will teach back shoulder protocol and light distal R UE elbow, wrist and hand ex with handouts and min cues   OT Frequency:  Other (comment) (shoulder)       AM-PAC OT 6 Clicks Daily Activity     Outcome Measure Help from another person eating meals?: A Little Help from another person taking care of personal grooming?: A Little Help from another person toileting, which includes using toliet, bedpan, or urinal?: A Little Help from another person bathing (including washing, rinsing, drying)?: A  Little Help from another person to put on and taking off regular upper body clothing?: A Little Help from another person to put on and taking off regular lower body clothing?: A Little 6 Click Score: 18   End of Session Equipment Utilized During Treatment: Gait belt;Rolling walker (2 wheels);Other (comment) (sling) Nurse Communication: Mobility status;Precautions;Weight bearing status  Activity Tolerance: Patient tolerated treatment well Patient left: in chair;with call bell/phone within reach;with chair alarm set  OT Visit Diagnosis: Unsteadiness on feet (R26.81);Other (comment) (R UE dysfunction)                Time: 8759-8676 OT Time Calculation (min): 43 min Charges:  OT General Charges $OT Visit: 1 Visit OT Evaluation $OT Eval Low Complexity: 1 Low OT Treatments $Self Care/Home Management : 8-22 mins $Therapeutic Exercise: 8-22 mins  Teresa Kidd OT/L Acute Rehabilitation Department  [phone]  06/15/2024, 3:13 PM

## 2024-06-16 DIAGNOSIS — M19011 Primary osteoarthritis, right shoulder: Secondary | ICD-10-CM | POA: Diagnosis not present

## 2024-06-16 NOTE — Progress Notes (Signed)
 Occupational Therapy Treatment Patient Details Name: Teresa Kidd MRN: [id number] DOB: 1941/09/03 Today's Date: 06/16/2024   History of present illness Patient is 83 y.o. female s/p R Reverse TSA on 06/14/24 PMH: Rt THA anterior HTN, hypothyroidism, GERD, OA, DVT, breast cancer.   OT comments  Patient seen for OT session with family present for training for the following:  Education completed regarding compensatory strategies for ADL tasks and functional mobility, management of sling, R ROM per specified parameters in the order set as indicated below, positioning of operative arm in sitting and supine and edema control, including use of Iceman Cold Therapy machine. Caregiver present for education, written handouts provided and reviewed using Teach Back and pt/caregiver verbalized/demonstrated understanding. Due to the below listed deficits, pt requires min assistance with ADL tasks and Supervision assist with functional mobility using RW. Caregiver will be able to provide necessary level of assistance at discharge. Pt to follow up with MD to progress rehab of the operative shoulder.Educated on use of gait belt for falls prevention with + teach back from caregiver. Also, recommended life alert and family confirmed nanny cam for supervision and for patient to call for assist as needed to family in other rooms.       If plan is discharge home, recommend the following:  A little help with walking and/or transfers;A little help with bathing/dressing/bathroom;Direct supervision/assist for medications management;Direct supervision/assist for financial management;Assist for transportation;Help with stairs or ramp for entrance;Supervision due to cognitive status   Equipment Recommendations  None recommended by OT       Precautions / Restrictions Precautions Precautions: Shoulder Precaution Booklet Issued: Yes (comment) Recall of Precautions/Restrictions: Impaired Required Braces or Orthoses:  Sling Restrictions Weight Bearing Restrictions Per Provider Order: Yes RUE Weight Bearing Per Provider Order: Non weight bearing RUE Partial Weight Bearing Percentage or Pounds: Per ortho PA  she can use it for basic adls , elbow , wrist , digit exercises , no lifting more than 1-2 pounds   but when not doing that will stay in the sling Other Position/Activity Restrictions: sling       Mobility Bed Mobility Overal bed mobility: Needs Assistance Bed Mobility: Supine to Sit, Sit to Supine     Supine to sit: Supervision Sit to supine: Supervision   General bed mobility comments: HOB elevated but no physical assist    Transfers Overall transfer level: Needs assistance Equipment used: Rolling walker (2 wheels) Transfers: Sit to/from Stand, Bed to chair/wheelchair/BSC Sit to Stand: Supervision     Step pivot transfers: Supervision     General transfer comment: ambulated with RW with light use of R UE on RW and supervision to and from bathroom and into recliner     Balance Overall balance assessment: Mild deficits observed, not formally tested                                         ADL either performed or assessed with clinical judgement   ADL Overall ADL's : Needs assistance/impaired Eating/Feeding: Modified independent;Sitting   Grooming: Wash/dry hands;Wash/dry face;Oral care;Brushing hair;Sitting;Modified independent   Upper Body Bathing: Sitting;Minimal assistance   Lower Body Bathing: Minimal assistance;Sit to/from stand   Upper Body Dressing : Minimal assistance;Sitting Upper Body Dressing Details (indicate cue type and reason): min cues with handout for sequencing UE dressing, caregiver present for training Lower Body Dressing: Minimal assistance;Sit to/from stand   Toilet Transfer:  Contact guard assist;Rolling walker (2 wheels)   Toileting- Clothing Manipulation and Hygiene: Supervision/safety   Tub/ Engineer, structural: Contact guard assist    Functional mobility during ADLs: Contact guard assist General ADL Comments: family present for training    Extremity/Trunk Assessment Upper Extremity Assessment Upper Extremity Assessment: Right hand dominant;RUE deficits/detail RUE Deficits / Details: R UE nerve block still very minorly active but distal R UE elbow, wrist and hand WFL's RUE Coordination: decreased fine motor   Lower Extremity Assessment Lower Extremity Assessment: Overall WFL for tasks assessed        Vision   Vision Assessment?: No apparent visual deficits;Wears glasses for reading         Communication Communication Communication: No apparent difficulties   Cognition Arousal: Alert Behavior During Therapy: WFL for tasks assessed/performed Cognition: History of cognitive impairments             OT - Cognition Comments: mild STM deficits but none noted this session                 Following commands: Intact        Cueing   Cueing Techniques: Verbal cues  Exercises Exercises: Shoulder Shoulder Exercises Elbow Flexion: AAROM, Right, 10 reps Elbow Extension: AAROM, 10 reps, Right Wrist Flexion: AAROM, Right, 10 reps Wrist Extension: AAROM, 10 reps, Right Digit Composite Flexion: AAROM, Right, 10 reps Composite Extension: AAROM, Right, 10 reps    Shoulder Instructions Shoulder Instructions Donning/doffing shirt without moving shoulder: Minimal assistance;Caregiver independent with task Method for sponge bathing under operated UE: Minimal assistance;Caregiver independent with task Donning/doffing sling/immobilizer: Minimal assistance;Caregiver independent with task Correct positioning of sling/immobilizer: Minimal assistance;Caregiver independent with task ROM for elbow, wrist and digits of operated UE: Minimal assistance;Caregiver independent with task Sling wearing schedule (on at all times/off for ADL's): Minimal assistance;Caregiver independent with task Proper positioning of operated  UE when showering: Minimal assistance;Caregiver independent with task Positioning of UE while sleeping: Minimal assistance;Caregiver independent with task     General Comments R post op dressing    Pertinent Vitals/ Pain       Pain Assessment Pain Assessment: Faces Faces Pain Scale: No hurt Pain Intervention(s): Premedicated before session, Repositioned, Ice applied, Monitored during session   Frequency  Other (comment) (shoulder)        Progress Toward Goals  OT Goals(current goals can now be found in the care plan section)  Progress towards OT goals: Progressing toward goals  Acute Rehab OT Goals Patient Stated Goal: to go home OT Goal Formulation: With patient Time For Goal Achievement: 06/29/24 Potential to Achieve Goals: Good ADL Goals Pt Will Perform Upper Body Bathing: with contact guard assist;sitting Pt Will Perform Lower Body Bathing: with min assist;sit to/from stand Pt Will Perform Upper Body Dressing: with min assist;sitting Pt Will Perform Lower Body Dressing: with min assist;sit to/from stand Pt Will Transfer to Toilet: with supervision;ambulating;bedside commode Additional ADL Goal #1: Patient will teach back shoulder protocol and light distal R UE elbow, wrist and hand ex with handouts and min cues  Plan         AM-PAC OT 6 Clicks Daily Activity     Outcome Measure   Help from another person eating meals?: None Help from another person taking care of personal grooming?: None Help from another person toileting, which includes using toliet, bedpan, or urinal?: A Little Help from another person bathing (including washing, rinsing, drying)?: A Little Help from another person to put on and taking off regular upper  body clothing?: A Little Help from another person to put on and taking off regular lower body clothing?: A Little 6 Click Score: 20    End of Session Equipment Utilized During Treatment: Gait belt;Rolling walker (2 wheels);Other (comment)  (sling)  OT Visit Diagnosis: Unsteadiness on feet (R26.81);Other (comment) (R UE dysfunction)   Activity Tolerance Patient tolerated treatment well   Patient Left in chair;with call bell/phone within reach;with chair alarm set;with family/visitor present   Nurse Communication Mobility status;Precautions;Weight bearing status;Other (comment) (family trained in OT and shoulder protocols)        Time: 1250-1320 OT Time Calculation (min): 30 min  Charges: OT General Charges $OT Visit: 1 Visit OT Treatments $Self Care/Home Management : 8-22 mins $Therapeutic Exercise: 8-22 mins  Leoma Folds OT/L Acute Rehabilitation Department  [phone]  06/16/2024, 4:17 PM

## 2024-06-16 NOTE — Progress Notes (Signed)
 Subjective: 2 Days Post-Op Procedure(s) (LRB): ARTHROPLASTY, SHOULDER, TOTAL, REVERSE (Right)  Patient reports pain as appropriately controlled. Denies any new numbness/tingling.   Objective:   VITALS:  Temp:  [98.3 F (36.8 C)-99 F (37.2 C)] 98.3 F (36.8 C) (08/10 0513) Pulse Rate:  [64-71] 65 (08/10 0513) Resp:  [16-19] 16 (08/10 0513) BP: (117-127)/(55-69) 127/66 (08/10 0513) SpO2:  [94 %-100 %] 97 % (08/10 0513)  Neurovascular intact Sensation intact distally Intact pulses distally Dorsiflexion/Plantar flexion intact Incision: dressing C/D/I No cellulitis present Compartment soft   LABS Recent Labs    06/14/24 1415  HGB 9.9*  WBC 9.0  PLT 156   Recent Labs    06/14/24 1415  CREATININE 1.27*   No results for input(s): LABPT, INR in the last 72 hours.   Assessment/Plan: 2 Days Post-Op Procedure(s) (LRB): ARTHROPLASTY, SHOULDER, TOTAL, REVERSE (Right)  Advance diet Up with therapy D/C IV fluids Plan for discharge today  Lillia Mountain 06/16/2024, 7:54 AM

## 2024-06-16 NOTE — Plan of Care (Signed)
  Problem: Elimination: Goal: Will not experience complications related to urinary retention Outcome: Progressing   Problem: Safety: Goal: Ability to remain free from injury will improve Outcome: Progressing   Problem: Education: Goal: Knowledge of the prescribed therapeutic regimen will improve Outcome: Progressing   Problem: Pain Management: Goal: Pain level will decrease with appropriate interventions Outcome: Progressing

## 2024-06-16 NOTE — TOC Transition Note (Signed)
 Transition of Care [facility]) - Discharge Note   Patient Details  Name: Teresa Kidd MRN: [id number] Date of Birth: December 21, 1940  Transition of Care [facility] CM/SW Contact:  Jon ONEIDA Anon, RN Phone Number: 06/16/2024, 11:46 AM   Clinical Narrative:    NCM met with pt. Pt states she will call her daughter to have her grandson/ granddaughter pick her up by 3pm. Pt states she has RW, W/C, hospital bed, and BSC in the home. Pt has sling with iceman in place on rt arm. Informed pt to continue to use once home from hospital. There are no HH or any other DME needs. There are no other Care Management needs at this time.   Final next level of care: Home/Self Care Barriers to Discharge: No Barriers Identified   Patient Goals and CMS Choice Patient states their goals for this hospitalization and ongoing recovery are:: To return home CMS Medicare.gov Compare Post Acute Care list provided to:: Patient Choice offered to / list presented to : Patient Union ownership interest in [facility].provided to:: Patient    Discharge Placement                       Discharge Plan and Services Additional resources added to the After Visit Summary for                  DME Arranged: N/A DME Agency: NA       HH Arranged: NA HH Agency: NA        Social Drivers of Health (SDOH) Interventions SDOH Screenings   Food Insecurity: No Food Insecurity (06/14/2024)  Housing: Low Risk  (06/14/2024)  Transportation Needs: No Transportation Needs (06/14/2024)  Utilities: Not At Risk (06/14/2024)  Social Connections: Socially Isolated (06/14/2024)  Tobacco Use: Low Risk  (06/14/2024)     Readmission Risk Interventions     No data to display

## 2024-06-17 ENCOUNTER — Encounter (HOSPITAL_COMMUNITY): Payer: Self-pay | Admitting: Orthopedic Surgery

## 2024-06-18 NOTE — Discharge Summary (Signed)
 Patient ID: Teresa Kidd MRN: [id number] DOB/AGE: 11-15-1940 83 y.o.  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   Weight as of this encounter: 53.1 kg.  Procedure:  Procedure(s) (LRB): ARTHROPLASTY, SHOULDER, TOTAL, REVERSE (Right)   Consults: None  HPI: Teresa Kidd is a 83 year old female presenting to the hospital for right reverse total shoulder for chronic shoulder pain and dysfunction. She was admitted for post operative monitoring or pain.   Laboratory Data: Admission on 06/14/2024, Discharged on 06/16/2024  Component Date Value Ref Range Status   WBC 06/14/2024 9.0  4.0 - 10.5 K/uL Final   RBC 06/14/2024 3.09 (L)  3.87 - 5.11 MIL/uL Final   Hemoglobin 06/14/2024 9.9 (L)  12.0 - 15.0 g/dL Final   HCT 91/91/7974 32.1 (L)  36.0 - 46.0 % Final   MCV 06/14/2024 103.9 (H)  80.0 - 100.0 fL Final   MCH 06/14/2024 32.0  26.0 - 34.0 pg Final   MCHC 06/14/2024 30.8  30.0 - 36.0 g/dL Final   RDW 91/91/7974 14.2  11.5 - 15.5 % Final   Platelets 06/14/2024 156  150 - 400 K/uL Final   nRBC 06/14/2024 0.0  0.0 - 0.2 % Final   Performed at [facility], [address], [place], [place] [address]   Creatinine, Ser 06/14/2024 1.27 (H)  0.44 - 1.00 mg/dL Final   GFR, Estimated 06/14/2024 42 (L)  >60 mL/min Final   Comment: (NOTE) Calculated using the CKD-EPI Creatinine Equation (2021) Performed at [facility], [address], [place], [place] [address]   Hospital Outpatient Visit on 06/03/2024  Component Date Value Ref Range Status   MRSA, PCR 06/03/2024 NEGATIVE  NEGATIVE Final   Staphylococcus aureus 06/03/2024 NEGATIVE  NEGATIVE Final   Comment: (NOTE) The Xpert SA Assay (FDA approved for NASAL specimens in patients 71 years of age and older), is one component of a comprehensive surveillance program. It is not intended to diagnose infection nor to guide or monitor treatment. Performed at [facility], [address], [place], [place] [address]    Sodium 06/03/2024 142  135 - 145 mmol/L Final   Potassium 06/03/2024 4.9  3.5 - 5.1 mmol/L Final   Chloride 06/03/2024 109  98 - 111 mmol/L Final   CO2 06/03/2024 24  22 - 32 mmol/L Final   Glucose, Bld 06/03/2024 97  70 - 99 mg/dL Final   Glucose reference range applies only to samples taken after fasting for at least 8 hours.   BUN 06/03/2024  33 (H)  8 - 23 mg/dL Final   Creatinine, Ser 06/03/2024 1.20 (H)  0.44 - 1.00 mg/dL Final   Calcium  06/03/2024 10.3  8.9 - 10.3 mg/dL Final   GFR, Estimated 06/03/2024 45 (L)  >60 mL/min Final   Comment: (NOTE) Calculated using the CKD-EPI Creatinine Equation (2021)    Anion gap 06/03/2024 9  5 - 15 Final   Performed at [facility], [address], [place], [place] [address]   WBC 06/03/2024 7.6  4.0 - 10.5 K/uL Final   RBC 06/03/2024 3.53 (L)  3.87 - 5.11 MIL/uL Final   Hemoglobin 06/03/2024 11.2 (L)  12.0 - 15.0 g/dL Final   HCT 92/71/7974 37.1  36.0 - 46.0 % Final   MCV 06/03/2024 105.1 (H)  80.0 - 100.0 fL Final   MCH 06/03/2024 31.7  26.0 - 34.0 pg Final    MCHC 06/03/2024 30.2  30.0 - 36.0 g/dL Final   RDW 92/71/7974 14.2  11.5 - 15.5 % Final   Platelets 06/03/2024 226  150 - 400 K/uL Final   nRBC 06/03/2024 0.0  0.0 - 0.2 % Final   Performed at [facility], [address] [place], [place] [address]     X-Rays:DG Shoulder Right Port Result Date: 06/14/2024 CLINICAL DATA:  Status post reverse shoulder arthroplasty. EXAM: RIGHT SHOULDER - 1 VIEW COMPARISON:  None Available. FINDINGS: Reverse right shoulder arthroplasty in expected alignment. No periprosthetic lucency or fracture. Recent postsurgical change includes air and edema in the joint space and soft tissues. IMPRESSION: Reverse right shoulder arthroplasty without immediate postoperative complication. Electronically Signed   By: Andrea Gasman M.D.   On: 06/14/2024 14:41   CT SHOULDER RIGHT WO CONTRAST Result Date: 06/06/2024 CLINICAL DATA:  Right shoulder pain EXAM: CT OF THE UPPER RIGHT EXTREMITY WITHOUT CONTRAST TECHNIQUE: Multidetector CT imaging of the upper right extremity was performed according to the standard protocol. RADIATION DOSE REDUCTION: This exam was performed according to the departmental dose-optimization program which includes automated exposure control, adjustment of the mA and/or kV according to patient size and/or use of iterative reconstruction technique. COMPARISON:  None FINDINGS: Bones/Joint/Cartilage Lower cervical plate and screw fixator. Markedly severe glenohumeral arthropathy with erosive findings along the glenoid and prominent glenohumeral spurring. Prominent degenerative AC joint arthropathy with fragmented spurring and calcifications in the soft tissue density above the joint. The moderately irregular humeral head surface is subluxed superiorly against the acromion implying a chronic supraspinatus tear. Chronic degenerative right sternoclavicular arthropathy. At least a mild glenohumeral joint effusion. Ligaments Suboptimally assessed by CT.  Muscles and Tendons Supraspinatus muscular atrophy likely reflecting chronic rotator cuff tear. Mild calcific tendinopathy in the distal infraspinatus tendon. Soft tissues Thoracic aortic atherosclerosis. Scarring in the medial inferior right upper lobe. To the right of the T9-10 neural foramen on image 104 series 2, a 1.3 cm paraspinal soft tissue mass is present on image 104 series 2. This is not changed from 08/01/2011 and is considered benign. It is most likely a schwannoma. IMPRESSION: 1. Markedly severe glenohumeral arthropathy with erosive findings along the glenoid and prominent glenohumeral spurring. 2. Prominent degenerative AC joint arthropathy with fragmented spurring and calcifications in the soft tissue density above the joint. 3. 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 4. Mild calcific tendinopathy in the distal infraspinatus tendon. 5. Stable (from 2012) 1.3 cm paraspinal soft tissue mass to the right of the T9-10 neural foramen, most likely a benign schwannoma. 6.  Aortic Atherosclerosis (ICD10-I70.0).  Electronically Signed   By: Ryan Salvage M.D.   On: 06/06/2024 10:20    EKG: Orders placed or performed during the hospital encounter of 08/10/21   EKG 12-Lead   EKG 12-Lead     Hospital Course: Teresa Kidd is a 83 y.o. who was admitted to Hospital. They were brought to the operating room on 06/14/2024 and underwent Procedure(s): ARTHROPLASTY, SHOULDER, TOTAL, REVERSE.  Patient tolerated the procedure well and was later transferred to the recovery room and then to the orthopaedic floor for postoperative care.  They were given PO and IV analgesics for pain control following their surgery.  They were given 24 hours of postoperative antibiotics of  Anti-infectives (From admission, onward)    Start     Dose/Rate Route Frequency Ordered Stop   06/14/24 2200  ceFAZolin  (ANCEF ) IVPB 2g/100 mL premix        2 g 200 mL/hr over 30 Minutes Intravenous   Once 06/14/24 1317 06/14/24 2210   06/14/24 1700  hydroxychloroquine  (PLAQUENIL ) tablet 200 mg  Status:  Discontinued        200 mg Oral Daily 06/14/24 1308 06/16/24 1938   06/14/24 1400  ceFAZolin  (ANCEF ) IVPB 2g/100 mL premix  Status:  Discontinued        2 g 200 mL/hr over 30 Minutes Intravenous Every 6 hours 06/14/24 1308 06/14/24 1315   06/14/24 1040  vancomycin  (VANCOCIN ) powder  Status:  Discontinued          As needed 06/14/24 1040 06/14/24 1146   06/14/24 0745  ceFAZolin  (ANCEF ) IVPB 2g/100 mL premix        2 g 200 mL/hr over 30 Minutes Intravenous On call to O.R. 06/14/24 9260 06/14/24 1016      and started on DVT prophylaxis in the form of Aspirin .    OT wordes red for total joint protocol.  Discharge planning consulted to help with postop disposition and equipment needs.  Patient had an uneventful night on the evening of surgery.  They started to get up OOB with therapy on day one. .  Continued to work with therapy into day two.  the patient had progressed with therapy and meeting their goals.  Patient was seen in rounds and was ready to go home.   Diet: Regular diet Activity:NWB, ok for use with ADLs, no lifting.  Follow-up:in 2 weeks Disposition - Home Discharged Condition: good   Discharge Instructions     Discharge patient   Complete by: As directed    Discharge disposition: 06-Home-Health Care Svc   Discharge patient date: 06/15/2024   Discharge patient   Complete by: As directed    Discharge disposition: 06-Home-Health Care Svc   Discharge patient date: 06/16/2024      Allergies as of 06/16/2024       Reactions   Feldene [piroxicam] Itching   In palm of hands & bottom of feets        Medication List     TAKE these medications    acetaminophen  500 MG tablet Commonly known as: TYLENOL  Take 500-1,000 mg by mouth every 6 (six) hours as needed for moderate pain (pain score 4-6).   B-12 5000 MCG Caps Take 5,000 mcg by mouth daily.   DULoxetine  30 MG  capsule Commonly known as: CYMBALTA  Take 30 mg by mouth daily.   DULoxetine  60 MG capsule Commonly known as: CYMBALTA  Take 60 mg by mouth daily.   folic acid  1 MG tablet Commonly known as: FOLVITE  Take 1 mg by  mouth daily.   gabapentin  600 MG tablet Commonly known as: NEURONTIN  Take 600 mg by mouth 3 (three) times daily.   HYDROcodone -acetaminophen  5-325 MG tablet Commonly known as: NORCO/VICODIN Take 1 tablet by mouth every 6 (six) hours as needed for moderate pain (pain score 4-6).   hydroxychloroquine  200 MG tablet Commonly known as: PLAQUENIL  Take 200 mg by mouth daily.   levothyroxine  75 MCG tablet Commonly known as: SYNTHROID  Take 75 mcg by mouth daily before breakfast.   lisinopril  10 MG tablet Commonly known as: ZESTRIL  Take 10 mg by mouth daily.   Magnesium  250 MG Tabs Take 250 mg by mouth daily.   meclizine  12.5 MG tablet Commonly known as: ANTIVERT  Take 25 mg by mouth 3 (three) times daily.   omeprazole 20 MG capsule Commonly known as: PRILOSEC Take 40 mg by mouth daily.   ondansetron  4 MG disintegrating tablet Commonly known as: ZOFRAN -ODT Take 1 tablet (4 mg total) by mouth every 8 (eight) hours as needed for nausea or vomiting.   potassium chloride  SA 20 MEQ tablet Commonly known as: KLOR-CON  M Take 20 mEq by mouth 2 (two) times daily.   predniSONE  5 MG tablet Commonly known as: DELTASONE  Take 5 mg by mouth daily with breakfast.   rosuvastatin  10 MG tablet Commonly known as: CRESTOR  Take 10 mg by mouth at bedtime.        Follow-up Information     Sharl Selinda [place], MD Follow up in 2 week(s).   Specialty: Orthopedic Surgery Why: For wound re-check Contact information: [address] [place] [address] [place] [address] [phone]                 Signed: Dayle Moores PA-C Orthopaedic Surgery 06/18/2024, 10:26 AM

## 2024-06-18 NOTE — Discharge Summary (Signed)
 Patient ID: SMANTHA Kidd MRN: [id number] DOB/AGE: 11-15-1940 83 y.o.  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   Weight as of this encounter: 53.1 kg.  Procedure:  Procedure(s) (LRB): ARTHROPLASTY, SHOULDER, TOTAL, REVERSE (Right)   Consults: None  HPI: Teresa Kidd is a 83 year old female presenting to the hospital for right reverse total shoulder for chronic shoulder pain and dysfunction. She was admitted for post operative monitoring or pain.   Laboratory Data: Admission on 06/14/2024, Discharged on 06/16/2024  Component Date Value Ref Range Status   WBC 06/14/2024 9.0  4.0 - 10.5 K/uL Final   RBC 06/14/2024 3.09 (L)  3.87 - 5.11 MIL/uL Final   Hemoglobin 06/14/2024 9.9 (L)  12.0 - 15.0 g/dL Final   HCT 91/91/7974 32.1 (L)  36.0 - 46.0 % Final   MCV 06/14/2024 103.9 (H)  80.0 - 100.0 fL Final   MCH 06/14/2024 32.0  26.0 - 34.0 pg Final   MCHC 06/14/2024 30.8  30.0 - 36.0 g/dL Final   RDW 91/91/7974 14.2  11.5 - 15.5 % Final   Platelets 06/14/2024 156  150 - 400 K/uL Final   nRBC 06/14/2024 0.0  0.0 - 0.2 % Final   Performed at [facility], [address], [place], [place] [address]   Creatinine, Ser 06/14/2024 1.27 (H)  0.44 - 1.00 mg/dL Final   GFR, Estimated 06/14/2024 42 (L)  >60 mL/min Final   Comment: (NOTE) Calculated using the CKD-EPI Creatinine Equation (2021) Performed at [facility], [address], [place], [place] [address]   Hospital Outpatient Visit on 06/03/2024  Component Date Value Ref Range Status   MRSA, PCR 06/03/2024 NEGATIVE  NEGATIVE Final   Staphylococcus aureus 06/03/2024 NEGATIVE  NEGATIVE Final   Comment: (NOTE) The Xpert SA Assay (FDA approved for NASAL specimens in patients 71 years of age and older), is one component of a comprehensive surveillance program. It is not intended to diagnose infection nor to guide or monitor treatment. Performed at [facility], [address], [place], [place] [address]    Sodium 06/03/2024 142  135 - 145 mmol/L Final   Potassium 06/03/2024 4.9  3.5 - 5.1 mmol/L Final   Chloride 06/03/2024 109  98 - 111 mmol/L Final   CO2 06/03/2024 24  22 - 32 mmol/L Final   Glucose, Bld 06/03/2024 97  70 - 99 mg/dL Final   Glucose reference range applies only to samples taken after fasting for at least 8 hours.   BUN 06/03/2024  33 (H)  8 - 23 mg/dL Final   Creatinine, Ser 06/03/2024 1.20 (H)  0.44 - 1.00 mg/dL Final   Calcium  06/03/2024 10.3  8.9 - 10.3 mg/dL Final   GFR, Estimated 06/03/2024 45 (L)  >60 mL/min Final   Comment: (NOTE) Calculated using the CKD-EPI Creatinine Equation (2021)    Anion gap 06/03/2024 9  5 - 15 Final   Performed at [facility], [address], [place], [place] [address]   WBC 06/03/2024 7.6  4.0 - 10.5 K/uL Final   RBC 06/03/2024 3.53 (L)  3.87 - 5.11 MIL/uL Final   Hemoglobin 06/03/2024 11.2 (L)  12.0 - 15.0 g/dL Final   HCT 92/71/7974 37.1  36.0 - 46.0 % Final   MCV 06/03/2024 105.1 (H)  80.0 - 100.0 fL Final   MCH 06/03/2024 31.7  26.0 - 34.0 pg Final    MCHC 06/03/2024 30.2  30.0 - 36.0 g/dL Final   RDW 92/71/7974 14.2  11.5 - 15.5 % Final   Platelets 06/03/2024 226  150 - 400 K/uL Final   nRBC 06/03/2024 0.0  0.0 - 0.2 % Final   Performed at [facility], [address] [place], [place] [address]     X-Rays:DG Shoulder Right Port Result Date: 06/14/2024 CLINICAL DATA:  Status post reverse shoulder arthroplasty. EXAM: RIGHT SHOULDER - 1 VIEW COMPARISON:  None Available. FINDINGS: Reverse right shoulder arthroplasty in expected alignment. No periprosthetic lucency or fracture. Recent postsurgical change includes air and edema in the joint space and soft tissues. IMPRESSION: Reverse right shoulder arthroplasty without immediate postoperative complication. Electronically Signed   By: Andrea Gasman M.D.   On: 06/14/2024 14:41   CT SHOULDER RIGHT WO CONTRAST Result Date: 06/06/2024 CLINICAL DATA:  Right shoulder pain EXAM: CT OF THE UPPER RIGHT EXTREMITY WITHOUT CONTRAST TECHNIQUE: Multidetector CT imaging of the upper right extremity was performed according to the standard protocol. RADIATION DOSE REDUCTION: This exam was performed according to the departmental dose-optimization program which includes automated exposure control, adjustment of the mA and/or kV according to patient size and/or use of iterative reconstruction technique. COMPARISON:  None FINDINGS: Bones/Joint/Cartilage Lower cervical plate and screw fixator. Markedly severe glenohumeral arthropathy with erosive findings along the glenoid and prominent glenohumeral spurring. Prominent degenerative AC joint arthropathy with fragmented spurring and calcifications in the soft tissue density above the joint. The moderately irregular humeral head surface is subluxed superiorly against the acromion implying a chronic supraspinatus tear. Chronic degenerative right sternoclavicular arthropathy. At least a mild glenohumeral joint effusion. Ligaments Suboptimally assessed by CT.  Muscles and Tendons Supraspinatus muscular atrophy likely reflecting chronic rotator cuff tear. Mild calcific tendinopathy in the distal infraspinatus tendon. Soft tissues Thoracic aortic atherosclerosis. Scarring in the medial inferior right upper lobe. To the right of the T9-10 neural foramen on image 104 series 2, a 1.3 cm paraspinal soft tissue mass is present on image 104 series 2. This is not changed from 08/01/2011 and is considered benign. It is most likely a schwannoma. IMPRESSION: 1. Markedly severe glenohumeral arthropathy with erosive findings along the glenoid and prominent glenohumeral spurring. 2. Prominent degenerative AC joint arthropathy with fragmented spurring and calcifications in the soft tissue density above the joint. 3. 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 4. Mild calcific tendinopathy in the distal infraspinatus tendon. 5. Stable (from 2012) 1.3 cm paraspinal soft tissue mass to the right of the T9-10 neural foramen, most likely a benign schwannoma. 6.  Aortic Atherosclerosis (ICD10-I70.0).  Electronically Signed   By: Ryan Salvage M.D.   On: 06/06/2024 10:20    EKG: Orders placed or performed during the hospital encounter of 08/10/21   EKG 12-Lead   EKG 12-Lead     Hospital Course: MAKALYNN BERWANGER is a 83 y.o. who was admitted to Hospital. They were brought to the operating room on 06/14/2024 and underwent Procedure(s): ARTHROPLASTY, SHOULDER, TOTAL, REVERSE.  Patient tolerated the procedure well and was later transferred to the recovery room and then to the orthopaedic floor for postoperative care.  They were given PO and IV analgesics for pain control following their surgery.  They were given 24 hours of postoperative antibiotics of  Anti-infectives (From admission, onward)    Start     Dose/Rate Route Frequency Ordered Stop   06/14/24 2200  ceFAZolin  (ANCEF ) IVPB 2g/100 mL premix        2 g 200 mL/hr over 30 Minutes Intravenous   Once 06/14/24 1317 06/14/24 2210   06/14/24 1700  hydroxychloroquine  (PLAQUENIL ) tablet 200 mg  Status:  Discontinued        200 mg Oral Daily 06/14/24 1308 06/16/24 1938   06/14/24 1400  ceFAZolin  (ANCEF ) IVPB 2g/100 mL premix  Status:  Discontinued        2 g 200 mL/hr over 30 Minutes Intravenous Every 6 hours 06/14/24 1308 06/14/24 1315   06/14/24 1040  vancomycin  (VANCOCIN ) powder  Status:  Discontinued          As needed 06/14/24 1040 06/14/24 1146   06/14/24 0745  ceFAZolin  (ANCEF ) IVPB 2g/100 mL premix        2 g 200 mL/hr over 30 Minutes Intravenous On call to O.R. 06/14/24 9260 06/14/24 1016      and started on DVT prophylaxis in the form of Aspirin .    OT wordes red for total joint protocol.  Discharge planning consulted to help with postop disposition and equipment needs.  Patient had an uneventful night on the evening of surgery.  They started to get up OOB with therapy on day one. .  Continued to work with therapy into day two.  the patient had progressed with therapy and meeting their goals.  Patient was seen in rounds and was ready to go home.   Diet: Regular diet Activity:NWB, ok for use with ADLs, no lifting.  Follow-up:in 2 weeks Disposition - Home Discharged Condition: good   Discharge Instructions     Discharge patient   Complete by: As directed    Discharge disposition: 06-Home-Health Care Svc   Discharge patient date: 06/15/2024   Discharge patient   Complete by: As directed    Discharge disposition: 06-Home-Health Care Svc   Discharge patient date: 06/16/2024      Allergies as of 06/16/2024       Reactions   Feldene [piroxicam] Itching   In palm of hands & bottom of feets        Medication List     TAKE these medications    acetaminophen  500 MG tablet Commonly known as: TYLENOL  Take 500-1,000 mg by mouth every 6 (six) hours as needed for moderate pain (pain score 4-6).   B-12 5000 MCG Caps Take 5,000 mcg by mouth daily.   DULoxetine  30 MG  capsule Commonly known as: CYMBALTA  Take 30 mg by mouth daily.   DULoxetine  60 MG capsule Commonly known as: CYMBALTA  Take 60 mg by mouth daily.   folic acid  1 MG tablet Commonly known as: FOLVITE  Take 1 mg by  mouth daily.   gabapentin  600 MG tablet Commonly known as: NEURONTIN  Take 600 mg by mouth 3 (three) times daily.   HYDROcodone -acetaminophen  5-325 MG tablet Commonly known as: NORCO/VICODIN Take 1 tablet by mouth every 6 (six) hours as needed for moderate pain (pain score 4-6).   hydroxychloroquine  200 MG tablet Commonly known as: PLAQUENIL  Take 200 mg by mouth daily.   levothyroxine  75 MCG tablet Commonly known as: SYNTHROID  Take 75 mcg by mouth daily before breakfast.   lisinopril  10 MG tablet Commonly known as: ZESTRIL  Take 10 mg by mouth daily.   Magnesium  250 MG Tabs Take 250 mg by mouth daily.   meclizine  12.5 MG tablet Commonly known as: ANTIVERT  Take 25 mg by mouth 3 (three) times daily.   omeprazole 20 MG capsule Commonly known as: PRILOSEC Take 40 mg by mouth daily.   ondansetron  4 MG disintegrating tablet Commonly known as: ZOFRAN -ODT Take 1 tablet (4 mg total) by mouth every 8 (eight) hours as needed for nausea or vomiting.   potassium chloride  SA 20 MEQ tablet Commonly known as: KLOR-CON  M Take 20 mEq by mouth 2 (two) times daily.   predniSONE  5 MG tablet Commonly known as: DELTASONE  Take 5 mg by mouth daily with breakfast.   rosuvastatin  10 MG tablet Commonly known as: CRESTOR  Take 10 mg by mouth at bedtime.        Follow-up Information     Sharl Selinda [place], MD Follow up in 2 week(s).   Specialty: Orthopedic Surgery Why: For wound re-check Contact information: [address] [place] [address] [place] [address] [phone]                 Signed: Dayle Moores PA-C Orthopaedic Surgery 06/18/2024, 10:26 AM
